# Patient Record
Sex: Male | Born: 1980 | Race: White | Hispanic: No | Marital: Married | Smoking: Never smoker
Health system: Southern US, Community
[De-identification: ages and names within clinical notes are randomized; demographics above are authoritative.]

## PROBLEM LIST (undated history)

## (undated) DIAGNOSIS — I10 Essential (primary) hypertension: Secondary | ICD-10-CM

## (undated) DIAGNOSIS — Z5189 Encounter for other specified aftercare: Secondary | ICD-10-CM

## (undated) HISTORY — DX: Essential (primary) hypertension: I10

## (undated) HISTORY — PX: TONSILLECTOMY: SUR1361

## (undated) HISTORY — PX: VASECTOMY: SHX75

## (undated) HISTORY — DX: Encounter for other specified aftercare: Z51.89

## (undated) HISTORY — PX: WISDOM TOOTH EXTRACTION: SHX21

---

## 2005-11-01 ENCOUNTER — Ambulatory Visit: Payer: Self-pay | Admitting: Internal Medicine

## 2008-09-04 ENCOUNTER — Telehealth: Payer: Self-pay | Admitting: Internal Medicine

## 2008-09-05 ENCOUNTER — Ambulatory Visit: Payer: Self-pay | Admitting: Internal Medicine

## 2008-09-05 DIAGNOSIS — R079 Chest pain, unspecified: Secondary | ICD-10-CM

## 2008-09-05 LAB — CONVERTED CEMR LAB: CK-MB: 1.2 ng/mL (ref 0.3–4.0)

## 2010-09-11 ENCOUNTER — Encounter: Payer: Self-pay | Admitting: Internal Medicine

## 2010-10-04 ENCOUNTER — Other Ambulatory Visit (INDEPENDENT_AMBULATORY_CARE_PROVIDER_SITE_OTHER): Payer: BC Managed Care – PPO | Admitting: Internal Medicine

## 2010-10-04 DIAGNOSIS — Z0000 Encounter for general adult medical examination without abnormal findings: Secondary | ICD-10-CM

## 2010-10-04 LAB — POCT URINALYSIS DIPSTICK
Nitrite, UA: NEGATIVE
Protein, UA: NEGATIVE
Urobilinogen, UA: 0.2
pH, UA: 7

## 2010-10-04 LAB — CBC WITH DIFFERENTIAL/PLATELET
Basophils Absolute: 0 10*3/uL (ref 0.0–0.1)
Eosinophils Absolute: 0.1 10*3/uL (ref 0.0–0.7)
Hemoglobin: 14.8 g/dL (ref 13.0–17.0)
Lymphocytes Relative: 27.8 % (ref 12.0–46.0)
Monocytes Relative: 8.6 % (ref 3.0–12.0)
Neutro Abs: 4.3 10*3/uL (ref 1.4–7.7)
Neutrophils Relative %: 61.4 % (ref 43.0–77.0)
RDW: 13.2 % (ref 11.5–14.6)

## 2010-10-04 LAB — BASIC METABOLIC PANEL
Calcium: 9.3 mg/dL (ref 8.4–10.5)
Creatinine, Ser: 1 mg/dL (ref 0.4–1.5)
GFR: 94.2 mL/min (ref >60.00)
Glucose, Bld: 104 mg/dL — ABNORMAL HIGH (ref 70–99)
Sodium: 141 mEq/L (ref 135–145)

## 2010-10-04 LAB — LIPID PANEL
HDL: 44.1 mg/dL (ref >39.00)
LDL Cholesterol: 85 mg/dL (ref 0–99)
Total CHOL/HDL Ratio: 4
VLDL: 39.6 mg/dL (ref 0.0–40.0)

## 2010-10-04 LAB — HEPATIC FUNCTION PANEL
AST: 38 U/L — ABNORMAL HIGH (ref 0–37)
Alkaline Phosphatase: 38 U/L — ABNORMAL LOW (ref 39–117)
Bilirubin, Direct: 0.2 mg/dL (ref 0.0–0.3)
Total Bilirubin: 1.1 mg/dL (ref 0.3–1.2)

## 2010-10-04 LAB — TSH: TSH: 1.16 u[IU]/mL (ref 0.35–5.50)

## 2010-10-11 ENCOUNTER — Ambulatory Visit (INDEPENDENT_AMBULATORY_CARE_PROVIDER_SITE_OTHER): Payer: BC Managed Care – PPO | Admitting: Internal Medicine

## 2010-10-11 ENCOUNTER — Encounter: Payer: Self-pay | Admitting: Internal Medicine

## 2010-10-11 VITALS — BP 132/86 | HR 72 | Temp 97.9°F | Ht 69.25 in | Wt 200.0 lb

## 2010-10-11 DIAGNOSIS — Z23 Encounter for immunization: Secondary | ICD-10-CM

## 2010-10-11 DIAGNOSIS — Z0000 Encounter for general adult medical examination without abnormal findings: Secondary | ICD-10-CM

## 2010-10-11 NOTE — Progress Notes (Signed)
  Subjective:    Patient ID: Dan [name], male    DOB: 01-Mar-1981, 30 y.o.   MRN: [id number]  HPI  cpx  Past Medical History  Diagnosis Date  . Blood transfusion without reported diagnosis     HIV and Hep C negative-2000-pt report   Past Surgical History  Procedure Date  . Tonsillectomy     post op bleeding (pt report)    reports that he has never smoked. He does not have any smokeless tobacco history on file. His alcohol and drug histories not on file. family history includes Hyperlipidemia in his father.    Allergies  Allergen Reactions  . Amoxicillin     REACTION: as child unspecified     Review of Systems  patient denies chest pain, shortness of breath, orthopnea. Denies lower extremity edema, abdominal pain, change in appetite, change in bowel movements. Patient denies rashes, musculoskeletal complaints. No other specific complaints in a complete review of systems.      Objective:   Physical Exam  well-developed well-nourished male in no acute distress. HEENT exam atraumatic, normocephalic, neck supple without jugular venous distention. Chest clear to auscultation cardiac exam S1-S2 are regular. Abdominal exam overweight with bowel sounds, soft and nontender. Extremities no edema. Neurologic exam is alert with a normal gait.        Assessment & Plan:  Health maint utd

## 2012-01-31 ENCOUNTER — Ambulatory Visit (INDEPENDENT_AMBULATORY_CARE_PROVIDER_SITE_OTHER): Payer: BC Managed Care – PPO | Admitting: Internal Medicine

## 2012-01-31 ENCOUNTER — Encounter: Payer: Self-pay | Admitting: Internal Medicine

## 2012-01-31 VITALS — BP 140/90 | Temp 97.9°F | Wt 205.0 lb

## 2012-01-31 DIAGNOSIS — S0990XS Unspecified injury of head, sequela: Secondary | ICD-10-CM

## 2012-01-31 DIAGNOSIS — X58XXXS Exposure to other specified factors, sequela: Secondary | ICD-10-CM

## 2012-01-31 DIAGNOSIS — G44309 Post-traumatic headache, unspecified, not intractable: Secondary | ICD-10-CM

## 2012-01-31 DIAGNOSIS — IMO0001 Reserved for inherently not codable concepts without codable children: Secondary | ICD-10-CM

## 2012-01-31 NOTE — Patient Instructions (Signed)
Head imaging studies as discussed Call if any clinical change

## 2012-01-31 NOTE — Progress Notes (Signed)
  Subjective:    Patient ID: Dan [name], male    DOB: [birth year], 31 y.o.   MRN: [id number]  HPI 31 year old patient who presents with a one-week history of exertional headaches. Over this period of time headaches occur every time he attempts at weight training. He has been forced to discontinue weight training do to the predictable headaches. He describes a severe achiness involving the right frontal and right retro-orbital area. No nausea or vomiting. Denies any focal neurological symptoms. He has had a mild headache on a single occasion during sexual activity. No headache with  Valsalva maneuvers  such as cough or defecation. No recent or remote history of head trauma. No family history of headaches CNS neoplasm or CNS aneurysm.  Headaches do not occur with more modest exercise such as walking and light exercise. They do not occur with aerobic activities    Review of Systems  Neurological: Positive for headaches.       Objective:   Physical Exam  Constitutional: He is oriented to person, place, and time. He appears well-developed and well-nourished. No distress.  Eyes:       Fundi appear normal without papilledema  Neurological: He is alert and oriented to person, place, and time. He has normal reflexes. He displays normal reflexes. No cranial nerve deficit. He exhibits normal muscle tone. Coordination normal.          Assessment & Plan:  New onset exertional headaches in a patient with a normal neurological examination and benign family history.  Rule out posterior fossa neoplasm or vascular abnormality We'll proceed with brain MRI/ MRA. If these are negative we'll consider a trial of anti-inflammatory medications (indomethacin)  or possibly propranolol

## 2012-02-02 ENCOUNTER — Ambulatory Visit
Admission: RE | Admit: 2012-02-02 | Discharge: 2012-02-02 | Disposition: A | Discharge status: Home / Self Care | Payer: BC Managed Care – PPO | Source: Ambulatory Visit | Attending: Internal Medicine | Admitting: Internal Medicine

## 2012-02-02 ENCOUNTER — Telehealth: Payer: Self-pay | Admitting: Family Medicine

## 2012-02-02 DIAGNOSIS — G44309 Post-traumatic headache, unspecified, not intractable: Secondary | ICD-10-CM

## 2012-02-02 NOTE — Telephone Encounter (Signed)
[name] is Dan [name], but Dr. Kirtland Bouchard ordered an MRI, which [name] had today. He is leaving town Saturday, and wanted to know if he could be called tomorrow before 5pm with the results, if at all possible. Please advise.

## 2012-02-03 ENCOUNTER — Ambulatory Visit: Payer: BC Managed Care – PPO | Admitting: Internal Medicine

## 2012-02-03 NOTE — Telephone Encounter (Signed)
Pt returning Dr. Vernon Prey call - best number to call back at - [phone]

## 2012-02-15 ENCOUNTER — Encounter: Payer: Self-pay | Admitting: Internal Medicine

## 2012-02-15 ENCOUNTER — Telehealth: Payer: Self-pay | Admitting: Internal Medicine

## 2012-02-15 ENCOUNTER — Ambulatory Visit (INDEPENDENT_AMBULATORY_CARE_PROVIDER_SITE_OTHER): Payer: BC Managed Care – PPO | Admitting: Internal Medicine

## 2012-02-15 VITALS — BP 152/98 | Temp 98.9°F | Wt 202.0 lb

## 2012-02-15 DIAGNOSIS — R51 Headache: Secondary | ICD-10-CM

## 2012-02-15 DIAGNOSIS — G4484 Primary exertional headache: Secondary | ICD-10-CM

## 2012-02-15 DIAGNOSIS — I10 Essential (primary) hypertension: Secondary | ICD-10-CM | POA: Insufficient documentation

## 2012-02-15 MED ORDER — BENAZEPRIL HCL 10 MG PO TABS: 10.0000 mg | ORAL_TABLET | Freq: Every day | ORAL | Status: DC

## 2012-02-15 NOTE — Assessment & Plan Note (Signed)
Patient has been monitoring his blood pressure at home. Systolic blood pressure readings range between 140s and 150s. He has strong family history of hypertension. Start benazepril 10 mg once daily. We discussed potential side effects.  Unclear whether mild hypertension is contributing to exertional headaches.

## 2012-02-15 NOTE — Assessment & Plan Note (Signed)
30 year old white male with exertional headaches. MRI MRA of brain is negative for aneurysm. Patient advised to use 40 mg of ibuprofen prior to strenuous physical activity.

## 2012-02-15 NOTE — Telephone Encounter (Signed)
Caller: Maxson/Patient; PCP: Birdie Sons; CB#: [phone];  Call regarding Headache; sx started 3 weeks ago; h/a seem to come on with exercise; seen in the office on 01/31/12 and MRI and MRIA were negative; instructed to stop exercising for 1 week; still had h/a without exercise; instructed to call back if sx continued; pt would like to be seen in the office today; pt is leaving out of country for 2 weeks tomorrow; taking Eleve and nothing seems to work; Triaged per Headache Guideline; See in 24 hr d/t typical h/a and therapy is not working; pt would like to be seen today since leaving to go to [place] tomorrow for 2 weeks; appt made for 3:15pm today Dr Artist Pais; will comply

## 2012-02-15 NOTE — Progress Notes (Signed)
  Subjective:    Patient ID: Dan [name], male    DOB: Oct 29, 1980, 31 y.o.   MRN: [id number]  HPI  31 year old white male previously seen by his primary care physician for exertional headache for followup. Patient reports severe right-sided headache triggered by weight lifting. There was concern for possible aneurysm. MRI/MRA of brain was normal. Patient decreased his physical activity and recently returned to exercising. He has experiencing recurrent headache symptoms.  Patient noted also to have mildly elevated blood pressure readings. He monitors his blood pressures at home and systolic blood pressure readings range between 140s and 150s. He has strong family history of hypertension.  Review of Systems Negative for visual changes,  Negative for photophobia or nausea  Past Medical History  Diagnosis Date  . Blood transfusion without reported diagnosis     HIV and Hep C negative-2000-pt report    History   Social History  . Marital Status: Married    Spouse Name: N/A    Number of Children: N/A  . Years of Education: N/A   Occupational History  . Not on file.   Social History Main Topics  . Smoking status: Never Smoker   . Smokeless tobacco: Never Used  . Alcohol Use: Yes  . Drug Use: No  . Sexually Active: Not on file   Other Topics Concern  . Not on file   Social History Narrative  . No narrative on file    Past Surgical History  Procedure Date  . Tonsillectomy     post op bleeding (pt report)    Family History  Problem Relation Age of Onset  . Hyperlipidemia Father     Allergies  Allergen Reactions  . Amoxicillin     REACTION: as child unspecified    Current Outpatient Prescriptions on File Prior to Visit  Medication Sig Dispense Refill  . fish oil-omega-3 fatty acids 1000 MG capsule Take 3 g by mouth daily.      . Multiple Vitamin (MULTIVITAMIN) tablet Take 2 tablets by mouth daily.       . Nutritional Supplements (SOY PROTEIN SHAKE PO) Take  by mouth daily.      . benazepril (LOTENSIN) 10 MG tablet Take 1 tablet (10 mg total) by mouth daily.  30 tablet  1    BP 152/98  Temp 98.9 F (37.2 C) (Oral)  Wt 202 lb (91.627 kg)       Objective:   Physical Exam  Constitutional: He is oriented to person, place, and time. He appears well-developed and well-nourished.  Cardiovascular: Normal rate, regular rhythm and normal heart sounds.   No murmur heard.      No murmur with hand grip or Valsalva maneuver  Pulmonary/Chest: Effort normal and breath sounds normal. He has no wheezes.  Neurological: He is alert and oriented to person, place, and time. No cranial nerve deficit. He exhibits normal muscle tone.  Skin: Skin is warm and dry.  Psychiatric: He has a normal mood and affect. His behavior is normal.          Assessment & Plan:

## 2012-02-15 NOTE — Patient Instructions (Addendum)
Premedicate with ibuprofen 400 mg 1 hour before physical activity Please call our office if you experience any side effects from new blood pressure medication. Please complete the following lab tests before your next follow up appointment: BMET - 401.9

## 2012-03-16 ENCOUNTER — Ambulatory Visit (INDEPENDENT_AMBULATORY_CARE_PROVIDER_SITE_OTHER): Payer: BC Managed Care – PPO | Admitting: Internal Medicine

## 2012-03-16 ENCOUNTER — Encounter: Payer: Self-pay | Admitting: Internal Medicine

## 2012-03-16 VITALS — BP 118/76 | HR 72 | Temp 98.1°F | Resp 16 | Wt 201.0 lb

## 2012-03-16 DIAGNOSIS — I10 Essential (primary) hypertension: Secondary | ICD-10-CM

## 2012-03-16 DIAGNOSIS — R51 Headache: Secondary | ICD-10-CM

## 2012-03-16 DIAGNOSIS — G4484 Primary exertional headache: Secondary | ICD-10-CM

## 2012-03-16 LAB — BASIC METABOLIC PANEL
Chloride: 100 mEq/L (ref 96–112)
Potassium: 3.8 mEq/L (ref 3.5–5.1)
Sodium: 139 mEq/L (ref 135–145)

## 2012-03-16 MED ORDER — BENAZEPRIL HCL 10 MG PO TABS: 10.0000 mg | ORAL_TABLET | Freq: Every day | ORAL | Status: DC

## 2012-03-16 NOTE — Assessment & Plan Note (Signed)
Good response to benazepril. Continue 10 mg. Monitor which was a kidney function.

## 2012-03-16 NOTE — Progress Notes (Signed)
  Subjective:    Patient ID: Dan [name], male    DOB: Jun 20, 1981, 31 y.o.   MRN: [id number]  HPI  31 year old white male with history of exertional headache and hypertension for followup. Patient reports since previous visit he has only had one mild headache. He premedicated with Aleve before lifting weights. He is tolerating benazepril without difficulty.   Review of Systems No chest pain, no chronic cough  Past Medical History  Diagnosis Date  . Blood transfusion without reported diagnosis     HIV and Hep C negative-2000-pt report    History   Social History  . Marital Status: Married    Spouse Name: N/A    Number of Children: N/A  . Years of Education: N/A   Occupational History  . Not on file.   Social History Main Topics  . Smoking status: Never Smoker   . Smokeless tobacco: Never Used  . Alcohol Use: Yes  . Drug Use: No  . Sexually Active: Not on file   Other Topics Concern  . Not on file   Social History Narrative  . No narrative on file    Past Surgical History  Procedure Date  . Tonsillectomy     post op bleeding (pt report)    Family History  Problem Relation Age of Onset  . Hyperlipidemia Father     Allergies  Allergen Reactions  . Amoxicillin     REACTION: as child unspecified    Current Outpatient Prescriptions on File Prior to Visit  Medication Sig Dispense Refill  . fish oil-omega-3 fatty acids 1000 MG capsule Take 3 g by mouth daily.      . Multiple Vitamin (MULTIVITAMIN) tablet Take 2 tablets by mouth daily.       . Nutritional Supplements (SOY PROTEIN SHAKE PO) Take by mouth daily.      Marland Kitchen DISCONTD: benazepril (LOTENSIN) 10 MG tablet Take 1 tablet (10 mg total) by mouth daily.  30 tablet  1    BP 118/76  Pulse 72  Temp 98.1 F (36.7 C) (Oral)  Resp 16  Wt 201 lb (91.173 kg)  SpO2 98%       Objective:   Physical Exam  Constitutional: He is oriented to person, place, and time. He appears well-developed and  well-nourished.  Cardiovascular: Normal rate, regular rhythm and normal heart sounds.   Pulmonary/Chest: Effort normal and breath sounds normal. He has no wheezes.  Musculoskeletal: He exhibits no edema.  Neurological: He is alert and oriented to person, place, and time.          Assessment & Plan:

## 2012-03-16 NOTE — Assessment & Plan Note (Signed)
Exertional headache improved with treating blood pressure. Patient advised to use of short acting NSAID such as ibuprofen before weightlifting. Monitor electrolytes and kidney function. Patient understands to take ibuprofen with food and drink plenty of water.

## 2012-03-26 ENCOUNTER — Ambulatory Visit (INDEPENDENT_AMBULATORY_CARE_PROVIDER_SITE_OTHER): Payer: BC Managed Care – PPO | Admitting: Family Medicine

## 2012-03-26 ENCOUNTER — Encounter: Payer: Self-pay | Admitting: Family Medicine

## 2012-03-26 VITALS — BP 126/80 | Temp 98.4°F | Wt 207.0 lb

## 2012-03-26 DIAGNOSIS — R300 Dysuria: Secondary | ICD-10-CM

## 2012-03-26 DIAGNOSIS — N451 Epididymitis: Secondary | ICD-10-CM

## 2012-03-26 DIAGNOSIS — N453 Epididymo-orchitis: Secondary | ICD-10-CM

## 2012-03-26 LAB — POCT URINALYSIS DIPSTICK
Ketones, UA: NEGATIVE
Protein, UA: NEGATIVE
Spec Grav, UA: 1.015
pH, UA: 6

## 2012-03-26 MED ORDER — CIPROFLOXACIN HCL 500 MG PO TABS: 500.0000 mg | ORAL_TABLET | Freq: Two times a day (BID) | ORAL | Status: AC

## 2012-03-27 ENCOUNTER — Encounter: Payer: Self-pay | Admitting: Family Medicine

## 2012-03-27 NOTE — Progress Notes (Signed)
  Subjective:    Patient ID: Dan [name], male    DOB: 01-May-1981, 31 y.o.   MRN: [id number]  HPI Here for one week of mild discomfort in the penis and the testicles and of an intermittent "vibrating" sensation in the penis. No urinary urgency or burning. No urethral DC. No fever or back pain. He drinks a lot of water. He has never felt this before. No recent trauma or bike riding.    Review of Systems  Constitutional: Negative.   Gastrointestinal: Negative.   Genitourinary: Positive for penile pain and testicular pain. Negative for dysuria, urgency, frequency, hematuria, flank pain, decreased urine volume, discharge, penile swelling, scrotal swelling, enuresis, difficulty urinating and genital sores.       Objective:   Physical Exam  Constitutional: He appears well-developed and well-nourished.  Abdominal: Soft. Bowel sounds are normal. He exhibits no distension and no mass. There is no tenderness. There is no rebound and no guarding.  Genitourinary: Rectum normal, prostate normal and penis normal. No penile tenderness.       Mildly tender over both epididymes           Assessment & Plan:  Add Aleve bid prn .

## 2012-09-16 DIAGNOSIS — Z0000 Encounter for general adult medical examination without abnormal findings: Secondary | ICD-10-CM | POA: Insufficient documentation

## 2012-09-16 NOTE — Progress Notes (Signed)
Patient ID: Dan [name], male   DOB: 1981-01-16, 32 y.o.   MRN: [id number] htn- Tolerating meds but he thinks the meds are makin him exhausted.  He has not been following an exercise or diet program. No home BPs.  He denies daytime hypersomnolence.   Reviewed pmh, psh, sochx, meds   patient denies chest pain, shortness of breath, orthopnea. Denies lower extremity edema, abdominal pain, change in appetite, change in bowel movements. Patient denies rashes, musculoskeletal complaints. No other specific complaints in a complete review of systems.    Well-developed well-nourished male in no acute distress. HEENT exam atraumatic, normocephalic, extraocular muscles are intact. Neck is supple. No jugular venous distention no thyromegaly. Chest clear to auscultation without increased work of breathing. Cardiac exam S1 and S2 are regular. Abdominal exam active bowel sounds, soft, nontender. Extremities no edema. Neurologic exam she is alert without any motor sensory deficits. Gait is normal.

## 2012-09-17 ENCOUNTER — Ambulatory Visit (INDEPENDENT_AMBULATORY_CARE_PROVIDER_SITE_OTHER): Payer: BC Managed Care – PPO | Admitting: Internal Medicine

## 2012-09-17 ENCOUNTER — Encounter: Payer: Self-pay | Admitting: Internal Medicine

## 2012-09-17 VITALS — BP 156/100 | HR 76 | Temp 98.0°F | Wt 213.0 lb

## 2012-09-17 DIAGNOSIS — E663 Overweight: Secondary | ICD-10-CM

## 2012-09-17 DIAGNOSIS — R5381 Other malaise: Secondary | ICD-10-CM

## 2012-09-17 DIAGNOSIS — R5383 Other fatigue: Secondary | ICD-10-CM

## 2012-09-17 DIAGNOSIS — I10 Essential (primary) hypertension: Secondary | ICD-10-CM

## 2012-09-17 LAB — CBC WITH DIFFERENTIAL/PLATELET
Eosinophils Absolute: 0.1 10*3/uL (ref 0.0–0.7)
MCHC: 34.4 g/dL (ref 30.0–36.0)
MCV: 85.9 fl (ref 78.0–100.0)
Monocytes Absolute: 0.7 10*3/uL (ref 0.1–1.0)
Neutrophils Relative %: 63.7 % (ref 43.0–77.0)
Platelets: 148 10*3/uL — ABNORMAL LOW (ref 150.0–400.0)
RDW: 12.4 % (ref 11.5–14.6)

## 2012-09-17 LAB — BASIC METABOLIC PANEL
BUN: 13 mg/dL (ref 6–23)
CO2: 29 mEq/L (ref 19–32)
Chloride: 100 mEq/L (ref 96–112)
Creatinine, Ser: 1 mg/dL (ref 0.4–1.5)

## 2012-09-17 MED ORDER — LOSARTAN POTASSIUM 100 MG PO TABS: 100.0000 mg | ORAL_TABLET | Freq: Every day | ORAL | Status: DC

## 2012-09-17 NOTE — Assessment & Plan Note (Signed)
There is some concern that meds are causing fatigue Stop benazepril and start losartan  May need to consider OSA as cause of fatigue-- check labs today

## 2012-09-17 NOTE — Addendum Note (Signed)
Addended by: Bonnye Fava on: 09/17/2012 08:38 AM   Modules accepted: Orders

## 2012-09-20 ENCOUNTER — Telehealth: Payer: Self-pay | Admitting: Internal Medicine

## 2012-09-20 NOTE — Telephone Encounter (Signed)
Patient called stating that he would like a call back with lab results. Please assist.  °

## 2012-09-21 NOTE — Telephone Encounter (Signed)
Dr Lovell Sheehan looked at labs and stated that labs were normal but glucose is high.  If pt was not fasting then ok but if he was fasting then he needs to come in for a hgba1c for hyperglycemia.  Left message for pt to call back

## 2012-09-21 NOTE — Telephone Encounter (Signed)
Patient would like a call back today.

## 2012-09-21 NOTE — Telephone Encounter (Signed)
Pt was not fasting, told pt labs were normal

## 2013-09-12 ENCOUNTER — Encounter: Payer: Self-pay | Admitting: Family

## 2013-09-12 ENCOUNTER — Ambulatory Visit (INDEPENDENT_AMBULATORY_CARE_PROVIDER_SITE_OTHER): Payer: BC Managed Care – PPO | Admitting: Family

## 2013-09-12 VITALS — BP 122/84 | HR 79 | Temp 98.2°F | Wt 217.0 lb

## 2013-09-12 DIAGNOSIS — R059 Cough, unspecified: Secondary | ICD-10-CM

## 2013-09-12 DIAGNOSIS — R05 Cough: Secondary | ICD-10-CM

## 2013-09-12 DIAGNOSIS — J069 Acute upper respiratory infection, unspecified: Secondary | ICD-10-CM

## 2013-09-12 MED ORDER — GUAIFENESIN-CODEINE 100-10 MG/5ML PO SYRP: 5.0000 mL | ORAL_SOLUTION | Freq: Three times a day (TID) | ORAL | Status: DC | PRN

## 2013-09-12 MED ORDER — FLUTICASONE PROPIONATE 50 MCG/ACT NA SUSP: 2.0000 | Freq: Every day | NASAL | Status: DC

## 2013-09-12 NOTE — Patient Instructions (Signed)

## 2013-09-12 NOTE — Progress Notes (Signed)
Pre visit review using our clinic review tool, if applicable. No additional management support is needed unless otherwise documented below in the visit note. 

## 2013-09-12 NOTE — Progress Notes (Signed)
   Subjective:    Patient ID: Dan [name], male    DOB: 03/26/1981, 33 y.o.   MRN: [id number]  HPI 33 year old white male, nonsmoker, patient of Dr. Cato MulliganSwords in today with complaints of a cough, nasal congestion x4 days. Has been taken Mucinex to help some. Denies any fever, muscle aches or pain.   Review of Systems  HENT: Positive for congestion and postnasal drip.   Eyes: Negative.   Respiratory: Positive for cough. Negative for shortness of breath and wheezing.   Cardiovascular: Negative.   Musculoskeletal: Negative.   Skin: Negative.   Allergic/Immunologic: Negative.   Neurological: Negative.   Psychiatric/Behavioral: Negative.    Past Medical History  Diagnosis Date  . Blood transfusion without reported diagnosis     HIV and Hep C negative-2000-pt report    History   Social History  . Marital Status: Married    Spouse Name: N/A    Number of Children: N/A  . Years of Education: N/A   Occupational History  . Not on file.   Social History Main Topics  . Smoking status: Never Smoker   . Smokeless tobacco: Never Used  . Alcohol Use: Yes  . Drug Use: No  . Sexual Activity: Not on file   Other Topics Concern  . Not on file   Social History Narrative  . No narrative on file    Past Surgical History  Procedure Laterality Date  . Tonsillectomy      post op bleeding (pt report)    Family History  Problem Relation Age of Onset  . Hyperlipidemia Father     Allergies  Allergen Reactions  . Amoxicillin     REACTION: as child unspecified    Current Outpatient Prescriptions on File Prior to Visit  Medication Sig Dispense Refill  . losartan (COZAAR) 100 MG tablet Take 1 tablet (100 mg total) by mouth daily.  90 tablet  3   No current facility-administered medications on file prior to visit.    BP 122/84  Pulse 79  Temp(Src) 98.2 F (36.8 C) (Oral)  Wt 217 lb (98.431 kg)chart    Objective:   Physical Exam  Constitutional: He is oriented to  person, place, and time. He appears well-developed and well-nourished.  HENT:  Right Ear: External ear normal.  Left Ear: External ear normal.  Mouth/Throat: Oropharynx is clear and moist.  Nasal turbinates edematous  Neck: Normal range of motion. Neck supple.  Cardiovascular: Normal rate, regular rhythm and normal heart sounds.   Pulmonary/Chest: Effort normal and breath sounds normal.  Musculoskeletal: Normal range of motion.  Neurological: He is alert and oriented to person, place, and time.  Skin: Skin is warm and dry.  Psychiatric: He has a normal mood and affect.          Assessment & Plan:  Assessment: 1. Upper respiratory infection 2. Cough  Plan: Flonase 2 sprays in each nostril once a day. Her intestine as needed for cough. Warned of drowsiness. Do not drive. Patient upon the office if symptoms worsen or persist. Recheck as scheduled, and as needed.

## 2013-10-31 ENCOUNTER — Other Ambulatory Visit: Payer: Self-pay | Admitting: Internal Medicine

## 2014-10-30 ENCOUNTER — Ambulatory Visit (INDEPENDENT_AMBULATORY_CARE_PROVIDER_SITE_OTHER): Payer: BC Managed Care – PPO | Admitting: Family Medicine

## 2014-10-30 ENCOUNTER — Encounter: Payer: Self-pay | Admitting: Family Medicine

## 2014-10-30 VITALS — BP 100/64 | HR 89 | Temp 98.4°F | Ht 70.0 in | Wt 189.0 lb

## 2014-10-30 DIAGNOSIS — I10 Essential (primary) hypertension: Secondary | ICD-10-CM

## 2014-10-30 DIAGNOSIS — E663 Overweight: Secondary | ICD-10-CM

## 2014-10-30 NOTE — Assessment & Plan Note (Signed)
Much improved control after 30 lbs weight loss. No losartan today and BP 100/64. We decided to discontinue at this point but patient will give me updated Bp #s before he leaves country.

## 2014-10-30 NOTE — Progress Notes (Signed)
  Tana ConchStephen Dezirea Mccollister, MD Phone: [phone]  Subjective:   Dan RaiderMatthew H [name] is a 34 y.o. year old very pleasant male patient who presents with the following:  Hypertension Obesity/overweight -patient has lost 28 pounds in 2 months per our readings (30.5 per his readings at home). He is hoping to get off of his losartan due to loss in weight. [facility] diet- telemedicine. Mainly chicken, broccoli, fresh veggies. 6 weeks of weight loss and now transitioning to maintenance program to last 6 weeks. No exercise was a part of the weight loss but it is a part of maintenance. Home BPs are BP Readings from Last 3 Encounters:  10/30/14 100/64  09/12/13 122/84  09/17/12 156/100   Home BP monitoring- running low 110s over 60-70.  Compliant with medications-did not take losartan this morning with BP 110/70.  ROS-Denies any CP, HA, SOB, blurry vision, LE edema, transient weakness, orthopnea, PND.   Past Medical History- Patient Active Problem List   Diagnosis Date Noted  . Overweight 09/16/2012  . Hypertension 02/15/2012   Medications- reviewed and updated Current Outpatient Prescriptions  Medication Sig Dispense Refill  . fluticasone (FLONASE) 50 MCG/ACT nasal spray Place 2 sprays into both nostrils daily. (Patient not taking: Reported on 10/30/2014) 16 g 6  . losartan (COZAAR) 100 MG tablet TAKE 1 TABLET BY MOUTH DAILY (Patient not taking: Reported on 10/30/2014) 90 tablet 3   Objective: BP 100/64 mmHg  Pulse 89  Temp(Src) 98.4 F (36.9 C)  Ht 5\' 10"  (1.778 m)  Wt 189 lb (85.73 kg)  BMI 27.12 kg/m2 Gen: NAD, resting comfortably  CV: RRR no murmurs rubs or gallops Lungs: CTAB no crackles, wheeze, rhonchi Abdomen: soft/nontender/nondistended/normal bowel sounds.  Ext: no edema Skin: warm, dry, no rash   Assessment/Plan:  Hypertension Much improved control after 30 lbs weight loss. No losartan today and BP 100/64. We decided to discontinue at this point but patient will give me updated  Bp #s before he leaves country.    Overweight Lost 30 lbs through [employer] in town over 6 weeks. No transitioning to maintenance and will add exercise. Praised patient efforts and seems he made appropriate food choices in this time. BMI reads as 27 but I believe this is an appropriate weight for him given muscle mass. I agree with targetting maintenance and not further weight loss.    follow up for CPE next month as scheduled previously. He will messageme BPs on 22nd or 23rd of this month for last week.   Health Maintenance Due  Topic Date Due  . HIV Screening -asked to have patient request add on to labs for CPE for unprotected sex though with wife.  07/16/1995

## 2014-10-30 NOTE — Assessment & Plan Note (Signed)
Lost 30 lbs through [employer] [facility] in town over 6 weeks. No transitioning to maintenance and will add exercise. Praised patient efforts and seems he made appropriate food choices in this time. BMI reads as 27 but I believe this is an appropriate weight for him given muscle mass. I agree with targetting maintenance and not further weight loss.

## 2014-10-30 NOTE — Patient Instructions (Addendum)
Stay off losartan  Chart your blood pressures as you have been doing over the next week. Send me a message or call with your blood pressure log either on the 22nd or 23rd. Hoping you can go to the [place] without any blood pressure medicine

## 2014-11-06 ENCOUNTER — Telehealth: Payer: Self-pay | Admitting: Internal Medicine

## 2014-11-06 NOTE — Telephone Encounter (Signed)
FYI

## 2014-11-06 NOTE — Telephone Encounter (Signed)
Pt states his bp is doing great since being off his bp meds. It has been running 120/60, 118/66; 122/60 averaging these numbers. Pt states he feels great also, but was to touch base w/ you in a few days after his visit. Will see you 11/19/14!

## 2014-11-06 NOTE — Telephone Encounter (Signed)
That is GREAT news. Please wish him a safe and fun trip to the [place] for me.

## 2014-11-19 ENCOUNTER — Ambulatory Visit (INDEPENDENT_AMBULATORY_CARE_PROVIDER_SITE_OTHER): Payer: BC Managed Care – PPO | Admitting: Family Medicine

## 2014-11-19 ENCOUNTER — Encounter: Payer: Self-pay | Admitting: Family Medicine

## 2014-11-19 VITALS — BP 110/80 | HR 62 | Temp 98.1°F | Ht 70.0 in | Wt 183.0 lb

## 2014-11-19 DIAGNOSIS — Z0000 Encounter for general adult medical examination without abnormal findings: Secondary | ICD-10-CM

## 2014-11-19 DIAGNOSIS — Z7251 High risk heterosexual behavior: Secondary | ICD-10-CM

## 2014-11-19 LAB — LIPID PANEL
Cholesterol: 147 mg/dL (ref 0–200)
HDL: 37 mg/dL — ABNORMAL LOW (ref >39.00)
LDL Cholesterol: 78 mg/dL (ref 0–99)
NONHDL: 110
Total CHOL/HDL Ratio: 4
Triglycerides: 162 mg/dL — ABNORMAL HIGH (ref 0.0–149.0)
VLDL: 32.4 mg/dL (ref 0.0–40.0)

## 2014-11-19 LAB — COMPREHENSIVE METABOLIC PANEL
ALT: 33 U/L (ref 0–53)
AST: 18 U/L (ref 0–37)
Albumin: 4.3 g/dL (ref 3.5–5.2)
Alkaline Phosphatase: 40 U/L (ref 39–117)
BILIRUBIN TOTAL: 0.8 mg/dL (ref 0.2–1.2)
BUN: 14 mg/dL (ref 6–23)
CALCIUM: 9.9 mg/dL (ref 8.4–10.5)
CHLORIDE: 105 meq/L (ref 96–112)
CO2: 30 mEq/L (ref 19–32)
CREATININE: 1.03 mg/dL (ref 0.40–1.50)
GFR: 87.68 mL/min (ref >60.00)
Glucose, Bld: 100 mg/dL — ABNORMAL HIGH (ref 70–99)
Potassium: 4.7 mEq/L (ref 3.5–5.1)
Sodium: 141 mEq/L (ref 135–145)
Total Protein: 6.8 g/dL (ref 6.0–8.3)

## 2014-11-19 LAB — CBC
HCT: 45.9 % (ref 39.0–52.0)
HEMOGLOBIN: 15.4 g/dL (ref 13.0–17.0)
MCHC: 33.6 g/dL (ref 30.0–36.0)
MCV: 86.5 fl (ref 78.0–100.0)
PLATELETS: 150 10*3/uL (ref 150.0–400.0)
RBC: 5.31 Mil/uL (ref 4.22–5.81)
RDW: 13.5 % (ref 11.5–15.5)
WBC: 7.7 10*3/uL (ref 4.0–10.5)

## 2014-11-19 LAB — TSH: TSH: 0.89 u[IU]/mL (ref 0.35–4.50)

## 2014-11-19 NOTE — Patient Instructions (Addendum)
Update labs-mychart or call with results.   No longer need blood pressure medicine  Let's do 1 year follow up and if blood pressure remains in good shape and weight in good shape, may space to 2 years.

## 2014-11-19 NOTE — Progress Notes (Signed)
Pre visit review using our clinic review tool, if applicable. No additional management support is needed unless otherwise documented below in the visit note. 

## 2014-11-19 NOTE — Progress Notes (Signed)
Tana Conch, MD Phone: [phone]  Subjective:  Patient presents today for their annual physical. Chief complaint-noted.   Patient doing very well. Continues to lose weight with healthy eating and exercise changes. No longer requires BP medication.   Thinks was tested for HIV before age 34 but agrees to 1x repeat screen.   ROS- full  review of systems was completed and negative   The following were reviewed and entered/updated in epic: Past Medical History  Diagnosis Date  . 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 after tonsilectomy  . Hypertension    Patient Active Problem List   Diagnosis Date Noted  . Hypertension 02/15/2012    Priority: Medium  . Overweight 09/16/2012    Priority: Low   Past Surgical History  Procedure Laterality Date  . Tonsillectomy      post op bleeding (pt report)  . Wisdom tooth extraction     Family History  Problem Relation Age of Onset  . Hyperlipidemia Father   . Diabetes Paternal Grandfather     paternal grandmother  . Hypertension Father   . Thyroid disease Mother    Medications- reviewed and updated No current outpatient prescriptions on file.   No current facility-administered medications for this visit.    Allergies-reviewed and updated Allergies  Allergen Reactions  . Amoxicillin     REACTION: as child unspecified    History   Social History  . Marital Status: Married    Spouse Name: N/A  . Number of Children: N/A  . Years of Education: N/A   Social History Main Topics  . Smoking status: Never Smoker   . Smokeless tobacco: Never Used  . Alcohol Use: 0.0 - 1.2 oz/week    0-2 Standard drinks or equivalent per week  . Drug Use: No  . Sexual Activity:    Partners: Female   Other Topics Concern  . None   Social History Narrative   Married (wife with just obgyn), no children. 2 dogs-yorkies.       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 Oropharynx normal Neck: no thyromegaly CV: RRR no murmurs rubs or gallops Lungs: CTAB no crackles, wheeze, rhonchi Abdomen: soft/nontender/nondistended/normal bowel sounds. No rebound or guarding.  Ext: no edema Skin: warm, dry, no rash. No concerning lesions for cancer or precancer.  Neuro: grossly normal, moves all extremities, PERRLA  Assessment/Plan:  34 y.o. male presenting for annual physical.  Health Maintenance counseling: 1. Anticipatory guidance: Patient counseled regarding regular dental exams, wearing seatbelts, wearing sunscreen. Occasionally will see dermatology. Full skin check a few years ago 2. Risk factor reduction:  Advised patient of need for regular exercise (going to do some lifting as next step, primarily cardio right now) and diet rich and fruits and vegetables to reduce risk of heart attack and stroke.  3. Immunizations/screenings/ancillary studies Health Maintenance Due  Topic Date Due  . HIV Screening - today 07/16/1995  4. Overweight-current weight reasonable. Do not think requires further weight loss and BMI goal <25 due to muscle mass 5. BP- follow up 1 year at CPE as long as  maintains lower weight.   fasting Orders Placed This Encounter  Procedures  . CBC    [place]  . Comprehensive metabolic panel    [place]    Order Specific Question:  Has the patient fasted?    Answer:  No  . Lipid panel    [place]    Order Specific Question:  Has the patient fasted?    Answer:  No  . TSH    [place]  . HIV antibody (with reflex)

## 2014-11-20 LAB — HIV ANTIBODY (ROUTINE TESTING W REFLEX): HIV 1&2 Ab, 4th Generation: NONREACTIVE

## 2015-01-08 ENCOUNTER — Other Ambulatory Visit: Payer: Self-pay | Admitting: Internal Medicine

## 2015-06-03 ENCOUNTER — Encounter: Payer: Self-pay | Admitting: Family Medicine

## 2015-06-04 ENCOUNTER — Other Ambulatory Visit: Payer: Self-pay | Admitting: Family Medicine

## 2015-06-04 ENCOUNTER — Encounter: Payer: Self-pay | Admitting: Family Medicine

## 2015-11-16 ENCOUNTER — Other Ambulatory Visit (INDEPENDENT_AMBULATORY_CARE_PROVIDER_SITE_OTHER): Payer: BC Managed Care – PPO

## 2015-11-16 DIAGNOSIS — Z0000 Encounter for general adult medical examination without abnormal findings: Secondary | ICD-10-CM

## 2015-11-16 LAB — BASIC METABOLIC PANEL
BUN: 19 mg/dL (ref 6–23)
CHLORIDE: 103 meq/L (ref 96–112)
CO2: 33 mEq/L — ABNORMAL HIGH (ref 19–32)
CREATININE: 1 mg/dL (ref 0.40–1.50)
Calcium: 9.7 mg/dL (ref 8.4–10.5)
GFR: 90.2 mL/min (ref >60.00)
Glucose, Bld: 97 mg/dL (ref 70–99)
POTASSIUM: 4.4 meq/L (ref 3.5–5.1)
Sodium: 141 mEq/L (ref 135–145)

## 2015-11-16 LAB — LIPID PANEL
CHOLESTEROL: 152 mg/dL (ref 0–200)
HDL: 46 mg/dL (ref >39.00)
LDL Cholesterol: 76 mg/dL (ref 0–99)
NONHDL: 106.03
Total CHOL/HDL Ratio: 3
Triglycerides: 148 mg/dL (ref 0.0–149.0)
VLDL: 29.6 mg/dL (ref 0.0–40.0)

## 2015-11-16 LAB — CBC WITH DIFFERENTIAL/PLATELET
BASOS PCT: 0.3 % (ref 0.0–3.0)
Basophils Absolute: 0 10*3/uL (ref 0.0–0.1)
EOS ABS: 0 10*3/uL (ref 0.0–0.7)
EOS PCT: 0.4 % (ref 0.0–5.0)
HCT: 43.6 % (ref 39.0–52.0)
Hemoglobin: 14.6 g/dL (ref 13.0–17.0)
LYMPHS ABS: 1.7 10*3/uL (ref 0.7–4.0)
Lymphocytes Relative: 17.9 % (ref 12.0–46.0)
MCHC: 33.5 g/dL (ref 30.0–36.0)
MCV: 88.1 fl (ref 78.0–100.0)
MONO ABS: 0.7 10*3/uL (ref 0.1–1.0)
Monocytes Relative: 7.1 % (ref 3.0–12.0)
NEUTROS PCT: 74.3 % (ref 43.0–77.0)
Neutro Abs: 7.2 10*3/uL (ref 1.4–7.7)
PLATELETS: 144 10*3/uL — AB (ref 150.0–400.0)
RBC: 4.95 Mil/uL (ref 4.22–5.81)
RDW: 12.6 % (ref 11.5–15.5)
WBC: 9.7 10*3/uL (ref 4.0–10.5)

## 2015-11-16 LAB — POC URINALSYSI DIPSTICK (AUTOMATED)
Bilirubin, UA: NEGATIVE
Glucose, UA: NEGATIVE
Ketones, UA: NEGATIVE
LEUKOCYTES UA: NEGATIVE
NITRITE UA: NEGATIVE
PH UA: 7.5
Protein, UA: NEGATIVE
RBC UA: NEGATIVE
Spec Grav, UA: 1.02
UROBILINOGEN UA: 0.2

## 2015-11-16 LAB — TSH: TSH: 0.97 u[IU]/mL (ref 0.35–4.50)

## 2015-11-16 LAB — HEPATIC FUNCTION PANEL
ALBUMIN: 4.6 g/dL (ref 3.5–5.2)
ALT: 23 U/L (ref 0–53)
AST: 20 U/L (ref 0–37)
Alkaline Phosphatase: 63 U/L (ref 39–117)
BILIRUBIN DIRECT: 0.1 mg/dL (ref 0.0–0.3)
BILIRUBIN TOTAL: 0.9 mg/dL (ref 0.2–1.2)
TOTAL PROTEIN: 6.5 g/dL (ref 6.0–8.3)

## 2015-11-23 ENCOUNTER — Encounter: Payer: BC Managed Care – PPO | Admitting: Family Medicine

## 2015-12-03 ENCOUNTER — Ambulatory Visit (INDEPENDENT_AMBULATORY_CARE_PROVIDER_SITE_OTHER): Payer: BC Managed Care – PPO | Admitting: Family Medicine

## 2015-12-03 ENCOUNTER — Encounter: Payer: Self-pay | Admitting: Family Medicine

## 2015-12-03 VITALS — BP 110/70 | HR 63 | Temp 98.4°F | Ht 70.0 in | Wt 169.0 lb

## 2015-12-03 DIAGNOSIS — Z0000 Encounter for general adult medical examination without abnormal findings: Secondary | ICD-10-CM

## 2015-12-03 NOTE — Patient Instructions (Addendum)
No changes  You are doing fantastic

## 2015-12-03 NOTE — Progress Notes (Signed)
Tana Conch, MD Phone: [phone]  Subjective:  Patient presents today for their annual physical. Chief complaint-noted.   See problem oriented charting- ROS- full  review of systems was completed and negative including No chest pain or shortness of breath. No headache or blurry vision.   The following were reviewed and entered/updated in epic: Past Medical History  Diagnosis Date  . Blood transfusion without reported diagnosis     HIV and Hep C negative-early 90s, late 7s, pt report. after tonsilectomy  . Hypertension    Patient Active Problem List   Diagnosis Date Noted  . Health care maintenance 09/16/2012    Priority: Low  . Hypertension 02/15/2012    Priority: Low   Past Surgical History  Procedure Laterality Date  . Tonsillectomy      post op bleeding (pt report)  . Wisdom tooth extraction      Family History  Problem Relation Age of Onset  . Hyperlipidemia Father   . Diabetes Paternal Grandfather     paternal grandmother  . Hypertension Father   . Thyroid disease Mother     Medications- reviewed and updated No current outpatient prescriptions on file.   No current facility-administered medications for this visit.    Allergies-reviewed and updated Allergies  Allergen Reactions  . Amoxicillin     REACTION: as child unspecified    Social History   Social History  . Marital Status: Married    Spouse Name: N/A  . Number of Children: N/A  . Years of Education: N/A   Social History Main Topics  . Smoking status: Never Smoker   . Smokeless tobacco: Never Used  . Alcohol Use: 0.0 - 1.2 oz/week    0-2 Standard drinks or equivalent per week  . Drug Use: No  . Sexual Activity:    Partners: Female   Other Topics Concern  . None   Social History Narrative   Married (wife with just obgyn), no children. 2 dogs-yorkies.       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 Oropharynx normal Neck: no thyromegaly CV: RRR no murmurs rubs or gallops Lungs: CTAB no crackles, wheeze, rhonchi Abdomen: soft/nontender/nondistended/normal bowel sounds. No rebound or guarding.  Ext: no edema Skin: warm, dry Neuro: grossly normal, moves all extremities, PERRLA  Assessment/Plan:  35 y.o. male presenting for annual physical.  Health Maintenance counseling: 1. Anticipatory guidance: Patient counseled regarding regular dental exams, eye exams, wearing seatbelts.  2. Risk factor reduction:  Advised patient of need for regular exercise and diet rich and fruits and vegetables to reduce risk of heart attack and stroke. Has lost another 14 lbs with healthy eating and regular exercise- BP remains controlled without medication. Started crossfi and is really enjoying it.  Wt Readings from Last 3 Encounters:  12/03/15 169 lb (76.658 kg)  11/19/14 183 lb (83.008 kg)  10/30/14 189 lb (85.73 kg)  3. Immunizations/screenings/ancillary studies- up to date  Immunization History  Administered Date(s) Administered  . Influenza Split 05/15/2012  . Influenza, Seasonal, Injecte, Preservative Fre 06/03/2014  . Tdap 10/11/2010   4. Prostate cancer screening- start age 70 without family history  5. Colon cancer screening -  start age 35 without family history 6. Skin cancer screening- occasional dermatology visits  Return in about 2 years (around 12/02/2017) for physical. Return precautions advised.

## 2015-12-09 ENCOUNTER — Telehealth: Payer: Self-pay | Admitting: Family Medicine

## 2015-12-09 NOTE — Telephone Encounter (Signed)
Pt is at the travel med office at [facility] to get vaccinations for his upcoming trip to [place]. New policy in order for pt's insuranace to pay is a referral letter from the doctor stating he needs these travel vaccinations.  Fax: AttnJuline Patch: Dan [name] [phone]   Pt took off work this am to get these vaccines and would like to know if we can fax asap.

## 2015-12-09 NOTE — Telephone Encounter (Signed)
Info faxed

## 2016-04-04 ENCOUNTER — Ambulatory Visit (INDEPENDENT_AMBULATORY_CARE_PROVIDER_SITE_OTHER): Payer: BC Managed Care – PPO | Admitting: Family Medicine

## 2016-04-04 ENCOUNTER — Encounter: Payer: Self-pay | Admitting: Family Medicine

## 2016-04-04 VITALS — BP 120/72 | HR 65 | Temp 98.5°F | Wt 168.4 lb

## 2016-04-04 DIAGNOSIS — J029 Acute pharyngitis, unspecified: Secondary | ICD-10-CM | POA: Diagnosis not present

## 2016-04-04 NOTE — Progress Notes (Signed)
Pre visit review using our clinic review tool, if applicable. No additional management support is needed unless otherwise documented below in the visit note. 

## 2016-04-04 NOTE — Patient Instructions (Signed)
Viral pharyngitis  Treat with aleve twice a day for next 3-4 days. Suspect within 7-10 days total this will resolve. Some cases last 14 days but hopeful not. If it is lasting that long or you have new or worsening symptoms please let us know sooner.   Rest and hydration are going to be key- I know this will be hard with school starting but do your best

## 2016-04-04 NOTE — Progress Notes (Addendum)
Subjective:  Dan [name] is a 35 y.o. year old very pleasant male patient who presents for/with See problem oriented charting ROS- No chest pain or shortness of breath. No headache or blurry vision. No fever or chills.see any ROS included in HPI as well.   Past Medical History-  Patient Active Problem List   Diagnosis Date Noted  . Health care maintenance 09/16/2012    Priority: Low  . Hypertension 02/15/2012    Priority: Low    Medications- reviewed and updated, no meds  Objective: BP 120/72 (BP Location: Left Arm, Patient Position: Sitting, Cuff Size: Normal)   Pulse 65   Temp 98.5 F (36.9 C) (Oral)   Wt 168 lb 6.4 oz (76.4 kg)   SpO2 96%   BMI 24.16 kg/m  Gen: NAD, resting comfortably Tender cervical lymphadenopathy bilaterally pharynx erythematous- tonsils surgically absent TM normal CV: RRR no murmurs rubs or gallops Lungs: CTAB no crackles, wheeze, rhonchi Abdomen: soft/nontender/nondistended/normal bowel sounds.  Ext: no edema Skin: warm, dry, no rash Neuro: grossly normal, moves all extremities  Assessment/Plan:   Viral pharyngitis S: 4-5 days. Started with scratchy throat then had soreness in neck. Thought from working out. Felt swollen lymph nodes on both sides. Scratchy throat, stuffy head. Slightly "wobbly" in the AM - some pressure in head- gets better with day goes on. Today slightly better than yesterday. Yesterday worse day so far. Dealing with fatigue, feeling worn down. No medications tried other than some aleve- for sore throat and for mild headache. 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 Well appearing other than seems fatigued. Does not have tonsils- we discussed strep testing low yield considering also no fever, presence of cough. Advised rest, hydration,  nsaids. If not doing better in 14 days or febrile- return to care.   The duration of face-to-face time during this visit was 25 minutes. Greater than 50% of this time was spent in counseling, explanation of diagnosis, planning of further management, and/or coordination of care.     Return precautions advised.  Tana ConchStephen Toshio Slusher, MD

## 2018-03-26 ENCOUNTER — Encounter: Payer: Self-pay | Admitting: Family Medicine

## 2018-09-17 ENCOUNTER — Encounter: Payer: Self-pay | Admitting: Family Medicine

## 2018-09-17 ENCOUNTER — Ambulatory Visit: Payer: Self-pay

## 2018-09-17 MED ORDER — OSELTAMIVIR PHOSPHATE 75 MG PO CAPS: 75.0000 mg | ORAL_CAPSULE | Freq: Two times a day (BID) | ORAL | 0 refills | Status: DC

## 2018-09-17 NOTE — Addendum Note (Signed)
Addended by: Shelva MajesticHUNTER, STEPHEN O on: 09/17/2018 08:39 PM   Modules accepted: Orders

## 2018-09-17 NOTE — Telephone Encounter (Signed)
Pt. Is calling to report that his 10 mo. Old daughter was diagnosed with Influenza A today.  Stated she started with onset of fever on 09/15/18.  The pt. had onset of head and chest congestion last night.  Denied fever or chills today; has not checked temperature.  Denied traveling outside the [place] in past mo.     Is requesting Tamiflu.  Advised will send note to Dr. Durene Cal, and to expect a call back with any recommendation re: Tamiflu request.  Agrees with plan.    Reason for Disposition . [1] Influenza EXPOSURE within last 48 hours (2 days) AND [2] NOT HIGH RISK AND [3] strongly requests antiviral medication  Answer Assessment - Initial Assessment Questions 1. TYPE of EXPOSURE: "How were you exposed?" (e.g., close contact, not a close contact)     Infant daughter diagnosed with flu today 2. DATE of EXPOSURE: "When did the exposure occur?" (e.g., hour, days, weeks)     09/15/18 onset of fever with the child 3. PREGNANCY: "Is there any chance you are pregnant?" "When was your last menstrual period?"     N/a  4. HIGH RISK for COMPLICATIONS: "Do you have any heart or lung problems? Do you have a weakened immune system?" (e.g., CHF, COPD, asthma, HIV positive, chemotherapy, renal failure, diabetes mellitus, sickle cell anemia)     No immuno compromised condition 5. SYMPTOMS: "Do you have any symptoms?" (e.g., cough, fever, sore throat, difficulty breathing).     Head and chest congestion present; cough nonproductive; symptoms started last night.  Protocols used: INFLUENZA EXPOSURE-A-AH

## 2018-09-17 NOTE — Telephone Encounter (Signed)
I sent in tamiflu for him. Please have him schedule a physical in the next few months- he is close to being 3 years out from last visit which per insurance would make him a new patient nad would want to avoid this.

## 2018-09-17 NOTE — Telephone Encounter (Signed)
See note

## 2018-09-18 NOTE — Telephone Encounter (Signed)
Patient notified via My Chart

## 2018-10-15 ENCOUNTER — Encounter: Payer: Self-pay | Admitting: Family Medicine

## 2018-10-15 DIAGNOSIS — E559 Vitamin D deficiency, unspecified: Secondary | ICD-10-CM

## 2018-10-15 DIAGNOSIS — E039 Hypothyroidism, unspecified: Secondary | ICD-10-CM

## 2018-10-15 DIAGNOSIS — R5383 Other fatigue: Secondary | ICD-10-CM

## 2018-10-15 DIAGNOSIS — Z0000 Encounter for general adult medical examination without abnormal findings: Secondary | ICD-10-CM

## 2018-10-15 DIAGNOSIS — E118 Type 2 diabetes mellitus with unspecified complications: Secondary | ICD-10-CM

## 2018-10-15 DIAGNOSIS — I10 Essential (primary) hypertension: Secondary | ICD-10-CM

## 2018-10-31 ENCOUNTER — Other Ambulatory Visit: Payer: Self-pay

## 2018-10-31 ENCOUNTER — Other Ambulatory Visit: Payer: Self-pay | Admitting: Family Medicine

## 2018-10-31 ENCOUNTER — Other Ambulatory Visit (INDEPENDENT_AMBULATORY_CARE_PROVIDER_SITE_OTHER): Payer: BC Managed Care – PPO

## 2018-10-31 DIAGNOSIS — E118 Type 2 diabetes mellitus with unspecified complications: Secondary | ICD-10-CM | POA: Diagnosis not present

## 2018-10-31 DIAGNOSIS — E559 Vitamin D deficiency, unspecified: Secondary | ICD-10-CM | POA: Diagnosis not present

## 2018-10-31 DIAGNOSIS — R5383 Other fatigue: Secondary | ICD-10-CM

## 2018-10-31 DIAGNOSIS — I10 Essential (primary) hypertension: Secondary | ICD-10-CM | POA: Diagnosis not present

## 2018-10-31 DIAGNOSIS — R739 Hyperglycemia, unspecified: Secondary | ICD-10-CM

## 2018-10-31 DIAGNOSIS — E039 Hypothyroidism, unspecified: Secondary | ICD-10-CM

## 2018-10-31 LAB — COMPREHENSIVE METABOLIC PANEL
ALT: 17 U/L (ref 0–53)
AST: 16 U/L (ref 0–37)
Albumin: 4.6 g/dL (ref 3.5–5.2)
Alkaline Phosphatase: 40 U/L (ref 39–117)
BUN: 17 mg/dL (ref 6–23)
CO2: 31 mEq/L (ref 19–32)
Calcium: 9.4 mg/dL (ref 8.4–10.5)
Chloride: 103 mEq/L (ref 96–112)
Creatinine, Ser: 1.01 mg/dL (ref 0.40–1.50)
GFR: 82.54 mL/min (ref >60.00)
Glucose, Bld: 102 mg/dL — ABNORMAL HIGH (ref 70–99)
Potassium: 4.4 mEq/L (ref 3.5–5.1)
Sodium: 140 mEq/L (ref 135–145)
Total Bilirubin: 0.9 mg/dL (ref 0.2–1.2)
Total Protein: 6.7 g/dL (ref 6.0–8.3)

## 2018-10-31 LAB — CBC WITH DIFFERENTIAL/PLATELET
Basophils Absolute: 0 10*3/uL (ref 0.0–0.1)
Basophils Relative: 0.3 % (ref 0.0–3.0)
EOS ABS: 0.1 10*3/uL (ref 0.0–0.7)
Eosinophils Relative: 0.7 % (ref 0.0–5.0)
HEMATOCRIT: 44.6 % (ref 39.0–52.0)
Hemoglobin: 15.3 g/dL (ref 13.0–17.0)
Lymphocytes Relative: 25 % (ref 12.0–46.0)
Lymphs Abs: 1.8 10*3/uL (ref 0.7–4.0)
MCHC: 34.4 g/dL (ref 30.0–36.0)
MCV: 87.2 fl (ref 78.0–100.0)
Monocytes Absolute: 0.6 10*3/uL (ref 0.1–1.0)
Monocytes Relative: 7.7 % (ref 3.0–12.0)
Neutro Abs: 4.9 10*3/uL (ref 1.4–7.7)
Neutrophils Relative %: 66.3 % (ref 43.0–77.0)
Platelets: 149 10*3/uL — ABNORMAL LOW (ref 150.0–400.0)
RBC: 5.12 Mil/uL (ref 4.22–5.81)
RDW: 13.4 % (ref 11.5–15.5)
WBC: 7.4 10*3/uL (ref 4.0–10.5)

## 2018-10-31 LAB — VITAMIN D 25 HYDROXY (VIT D DEFICIENCY, FRACTURES): VITD: 24.12 ng/mL — AB (ref 30.00–100.00)

## 2018-10-31 LAB — LIPID PANEL
Cholesterol: 192 mg/dL (ref 0–200)
HDL: 54.5 mg/dL (ref >39.00)
LDL Cholesterol: 106 mg/dL — ABNORMAL HIGH (ref 0–99)
NonHDL: 137.47
Total CHOL/HDL Ratio: 4
Triglycerides: 159 mg/dL — ABNORMAL HIGH (ref 0.0–149.0)
VLDL: 31.8 mg/dL (ref 0.0–40.0)

## 2018-10-31 LAB — VITAMIN B12: VITAMIN B 12: 445 pg/mL (ref 211–911)

## 2018-10-31 LAB — TSH: TSH: 1.23 u[IU]/mL (ref 0.35–4.50)

## 2018-10-31 LAB — HEMOGLOBIN A1C: HEMOGLOBIN A1C: 5.6 % (ref 4.6–6.5)

## 2018-10-31 MED ORDER — CHOLECALCIFEROL 1.25 MG (50000 UT) PO TABS: ORAL_TABLET | ORAL | 0 refills | Status: DC

## 2018-11-05 ENCOUNTER — Encounter: Payer: BC Managed Care – PPO | Admitting: Family Medicine

## 2018-11-07 ENCOUNTER — Encounter: Payer: Self-pay | Admitting: Family Medicine

## 2018-11-07 ENCOUNTER — Telehealth: Payer: Self-pay

## 2018-11-07 NOTE — Telephone Encounter (Signed)
Dan [name], can you please fill out the form to have this addended.

## 2018-11-07 NOTE — Telephone Encounter (Signed)
Per Dr. Durene Cal -  Team- not sure when diabetes was entered as a diagnosis for his A1c-please change this to hyperglycemia- you may need Lea's help or labs help. This is one reason I recommend labs be done at time of visit due to potential for errors like this.   Forwarding to Lea to make change.

## 2018-11-13 NOTE — Telephone Encounter (Signed)
Ok.  Information added.

## 2018-11-13 NOTE — Patient Instructions (Signed)
Health Maintenance Due  Topic Date Due  . URINE MICROALBUMIN  07/15/1990    No flowsheet data found.

## 2018-12-28 ENCOUNTER — Encounter: Payer: Self-pay | Admitting: Family Medicine

## 2019-01-17 ENCOUNTER — Other Ambulatory Visit: Payer: Self-pay | Admitting: Family Medicine

## 2019-01-28 ENCOUNTER — Other Ambulatory Visit: Payer: Self-pay

## 2019-01-28 ENCOUNTER — Encounter: Payer: Self-pay | Admitting: Family Medicine

## 2019-01-28 ENCOUNTER — Ambulatory Visit (INDEPENDENT_AMBULATORY_CARE_PROVIDER_SITE_OTHER): Payer: BC Managed Care – PPO | Admitting: Family Medicine

## 2019-01-28 VITALS — BP 121/80 | HR 69 | Temp 98.4°F | Ht 70.0 in | Wt 177.4 lb

## 2019-01-28 DIAGNOSIS — E559 Vitamin D deficiency, unspecified: Secondary | ICD-10-CM | POA: Diagnosis not present

## 2019-01-28 DIAGNOSIS — Z0000 Encounter for general adult medical examination without abnormal findings: Secondary | ICD-10-CM

## 2019-01-28 DIAGNOSIS — Z87898 Personal history of other specified conditions: Secondary | ICD-10-CM | POA: Diagnosis not present

## 2019-01-28 LAB — VITAMIN D 25 HYDROXY (VIT D DEFICIENCY, FRACTURES): VITD: 65.91 ng/mL (ref 30.00–100.00)

## 2019-01-28 NOTE — Patient Instructions (Addendum)
Please stop by lab before you go If you do not have mychart- we will call you about results within 5 business days of [place] receiving them.  If you have mychart- we will send your results within 3 business days of [place] receiving them.  If abnormal or we want to clarify a result, we will call or mychart you to make sure you receive the message.  If you have questions or concerns or don't hear within 5-7 days, please send [place] a message or call us.   Consider counseling to hopefully help with anxiety/sleep

## 2019-01-28 NOTE — Progress Notes (Signed)
Phone: [phone]    Subjective:  Patient presents today for their annual physical. Chief complaint-noted.   See problem oriented charting- ROS- full  review of systems was completed and negative except for: sleep disturbance  The following were reviewed and entered/updated in epic: Past Medical History:  Diagnosis Date  . Blood transfusion without reported diagnosis    HIV and Hep C negative-early 90s, late 2980s, pt report. after tonsilectomy  . Hypertension    Patient Active Problem List   Diagnosis Date Noted  . Health care maintenance 09/16/2012    Priority: Low  . Hypertension 02/15/2012    Priority: Low   Past Surgical History:  Procedure Laterality Date  . TONSILLECTOMY     post op bleeding (pt report)  . WISDOM TOOTH EXTRACTION      Family History  Problem Relation Age of Onset  . Thyroid disease Mother   . Hyperlipidemia Father   . Hypertension Father   . Polycystic kidney disease Father   . Diabetes Paternal Grandfather        paternal grandmother  . Kidney disease Paternal Grandfather     Medications- reviewed and updated Current Outpatient Medications  Medication Sig Dispense Refill  . Cholecalciferol 1.25 MG (50000 UT) TABS 50,000 units PO qwk for 12 weeks. 12 tablet 0   No current facility-administered medications for this visit.     Allergies-reviewed and updated Allergies  Allergen Reactions  . Amoxicillin     REACTION: as child unspecified    Social History   Social History Narrative   Married (wife with just obgyn), Daughter charlotte born 2019. 1 Yorkie- other passed 2019.       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       Hobbies: golfing, boating, travel      Objective:  BP 121/80 (BP Location: Left Arm, Patient Position: Sitting, Cuff Size: Normal)   Pulse 69   Temp 98.4 F (36.9 C) (Oral)   Ht 5\' 10"  (1.778 m)   Wt 177 lb 6.1 oz (80.5  kg)   SpO2 99%   BMI 25.45 kg/m  Gen: NAD, resting comfortably HEENT: Mucous membranes are moist. Oropharynx normal Neck: no thyromegaly or cervical lymphadenopathy  CV: RRR no murmurs rubs or gallops Lungs: CTAB no crackles, wheeze, rhonchi Abdomen: soft/nontender/nondistended/normal bowel sounds. No rebound or guarding.  Ext: no edema Skin: warm, dry Neuro: grossly normal, moves all extremities, PERRLA     Assessment and Plan:  38 y.o. male presenting for annual physical.  Health Maintenance counseling: 1. Anticipatory guidance: Patient counseled regarding regular dental exams -q6 months, eye exams - yearly,  avoiding smoking and second hand smoke , limiting alcohol to 2 beverages per day.   2. Risk factor reduction:  Advised patient of need for regular exercise and diet rich and fruits and vegetables to reduce risk of heart attack and stroke. Exercise- doing well until covid 19- hasnt been able to get to gym so doing some stuff at home including ourdoors. Diet-reasonably healthy diet. .  Wt Readings from Last 3 Encounters:  01/28/19 177 lb 6.1 oz (80.5 kg)  04/04/16 168 lb 6.4 oz (76.4 kg)  12/03/15 169 lb (76.7 kg)  3. Immunizations/screenings/ancillary studies- up to date Immunization History  Administered Date(s) Administered  . Influenza Split 05/15/2012  . Influenza, Seasonal, Injecte, Preservative Fre 06/03/2014  . Influenza,inj,Quad PF,6+ Mos 05/13/2018  . Influenza-Unspecified 06/26/2017  . Tdap 10/11/2010   4.  Prostate cancer screening- no family history, start at age 51   5. Colon cancer screening - no family history, start at age 63 6. Skin cancer screening/prevention- intermittent dermatology visits. advised regular sunscreen use. Denies worrisome, changing, or new skin lesions.  7. Testicular cancer screening- advised monthly self exams  8. STD screening- patient opts out as monogomous 9. Never smoker-   Status of chronic or acute concerns   # vitamin D  deficiency- he started multivitamin after prior vitamin D boost for 12 weeks in march -update vitamin D today  # will try go go back and change a1c association to hyperglycemia from march 2020- this was done in error by my team- should have been hyperglycemia. No diabetes history  #history of fever Back in february patient treated with tamiflu as daughter had the flu- he spiked a fever despite being on tamiflu- fever gone next day and then he had cough for 3 weeks. At crossfit was told tough to breath.   # patient is taking unisom for sleep for last 3 nights and not able to sleep (10:30 to 12 30 then up on the couch a lot). Admits to higher stress levels - primarily work related- and contributing to this. Doing unisom 1-2 nights a week prior to recently when has been taking nightly. Diagnosed with anxiety when younger. Has done melatonin in past- used 5 mg dose.  -refer to counseling today as looking for non med based treatment -could also try 2-3 mg melatonin instad of 5-10mg   Lab/Order associations:not fasting    ICD-10-CM   1. Preventative health care  Z00.00 VITAMIN D 25 Hydroxy (Vit-D Deficiency, Fractures)    SAR CoV2 Serology (COVID 19)AB(IGG)IA  2. Vitamin D deficiency  E55.9 VITAMIN D 25 Hydroxy (Vit-D Deficiency, Fractures)  3. History of fever  Z87.898 SAR CoV2 Serology (COVID 19)AB(IGG)IA    No orders of the defined types were placed in this encounter.   Return precautions advised.   Garret Reddish, MD

## 2019-01-29 LAB — SAR COV2 SEROLOGY (COVID19)AB(IGG),IA: SARS CoV2 AB IGG: NEGATIVE

## 2019-03-07 ENCOUNTER — Encounter: Payer: Self-pay | Admitting: Family Medicine

## 2019-03-08 ENCOUNTER — Encounter: Payer: Self-pay | Admitting: *Deleted

## 2019-03-08 ENCOUNTER — Ambulatory Visit: Payer: BC Managed Care – PPO | Admitting: Family Medicine

## 2019-03-09 ENCOUNTER — Ambulatory Visit (INDEPENDENT_AMBULATORY_CARE_PROVIDER_SITE_OTHER): Payer: BC Managed Care – PPO | Admitting: Family Medicine

## 2019-03-09 ENCOUNTER — Encounter: Payer: Self-pay | Admitting: Family Medicine

## 2019-03-09 ENCOUNTER — Other Ambulatory Visit: Payer: Self-pay

## 2019-03-09 DIAGNOSIS — B360 Pityriasis versicolor: Secondary | ICD-10-CM

## 2019-03-09 NOTE — Progress Notes (Signed)
Virtual Visit via Video Note  I connected with pt on 03/09/19 at  9:40 AM EDT by a video enabled telemedicine application and verified that I am speaking with the correct person using two identifiers.  Location patient: home Location provider:work or home office Persons participating in the virtual visit: patient, provider  I discussed the limitations of evaluation and management by telemedicine and the availability of in person appointments. The patient expressed understanding and agreed to proceed.  Telemedicine visit is a necessity given the COVID-19 restrictions in place at the current time.  HPI: 38 y/o male being seen today for "spot on neck he wants looked at". Onset 2 wks ago of  doscoloration of skin on his neck, skin lighter in a large patch on back of neck on left. No itching.  It is a bit flaky.  The area of lighter skin turns mildly pink when he is in the sun a lot, then fades back to more of a hypopigmented color of skin after inside a while.  No pain in the area.  No other areas on body with similar appearance.   ROS: See pertinent positives and negatives per HPI.  Past Medical History:  Diagnosis Date  . 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 after tonsilectomy  . Hypertension     Past Surgical History:  Procedure Laterality Date  . TONSILLECTOMY     post op bleeding (pt report)  . WISDOM TOOTH EXTRACTION      Family History  Problem Relation Age of Onset  . Thyroid disease Mother   . Hyperlipidemia Father   . Hypertension Father   . Polycystic kidney disease Father   . Diabetes Paternal Grandfather        paternal grandmother  . Kidney disease Paternal Grandfather    Social History   Socioeconomic History  . Marital status: Married    Spouse name: Not on file  . Number of children: Not on file  . Years of education: Not on file  . Highest education level: Not on file  Occupational  History  . Not on file  Social Needs  . Financial resource strain: Not on file  . Food insecurity    Worry: Not on file    Inability: Not on file  . Transportation needs    Medical: Not on file    Non-medical: Not on file  Tobacco Use  . Smoking status: Never Smoker  . Smokeless tobacco: Never Used  Substance and Sexual Activity  . Alcohol use: Yes    Alcohol/week: 0.0 - 2.0 standard drinks  . Drug use: No  . Sexual activity: Yes    Partners: Female  Lifestyle  . Physical activity    Days per week: Not on file    Minutes per session: Not on file  . Stress: Not on file  Relationships  . Social Herbalist on phone: Not on file    Gets together: Not on file    Attends religious service: Not on file    Active member of club or organization: Not on file    Attends meetings of clubs or organizations: Not on file    Relationship status: Not on file  Other Topics Concern  . Not on file  Social History Narrative   Married (wife with just obgyn), Daughter charlotte born 2019. 1 Yorkie- other passed 2019.       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       Hobbies: golfing, boating, travel    Current Outpatient Medications:  .  Cholecalciferol 1.25 MG (50000 UT) TABS, 50,000 units PO qwk for 12 weeks., Disp: 12 tablet, Rfl: 0  EXAM:  VITALS per patient if applicable:There were no vitals taken for this visit.  GENERAL: alert, oriented, appears well and in no acute distress  HEENT: atraumatic, conjunttiva clear, no obvious abnormalities on inspection of external nose and ears  NECK: normal movements of the head and neck Back of neck on L he has an irregular shaped hypopigmented area of skin about 8-10 cm diameter, borders pretty well demarcated.  On computer screen image it is impossible to tell if any superficial desquamation.  No papules, pustules, vesicles, or hives. No  erythema.  LUNGS: on inspection no signs of respiratory distress, breathing rate appears normal, no obvious gross SOB, gasping or wheezing  CV: no obvious cyanosis  MS: moves all visible extremities without noticeable abnormality  PSYCH/NEURO: pleasant and cooperative, no obvious depression or anxiety, speech and thought processing grossly intact  LABS: none today    Chemistry      Component Value Date/Time   NA 140 10/31/2018 0821   K 4.4 10/31/2018 0821   CL 103 10/31/2018 0821   CO2 31 10/31/2018 0821   BUN 17 10/31/2018 0821   CREATININE 1.01 10/31/2018 0821      Component Value Date/Time   CALCIUM 9.4 10/31/2018 0821   ALKPHOS 40 10/31/2018 0821   AST 16 10/31/2018 0821   ALT 17 10/31/2018 0821   BILITOT 0.9 10/31/2018 0821     Lab Results  Component Value Date   WBC 7.4 10/31/2018   HGB 15.3 10/31/2018   HCT 44.6 10/31/2018   MCV 87.2 10/31/2018   PLT 149.0 (L) 10/31/2018   ASSESSMENT AND PLAN:  Discussed the following assessment and plan:  Tinea versicolor: recommended otc generic lamisil cream bid x 2-3 wks.   I discussed the assessment and treatment plan with the patient. The patient was provided an opportunity to ask questions and all were answered. The patient agreed with the plan and demonstrated an understanding of the instructions.   The patient was advised to call back or seek an in-person evaluation if the symptoms worsen or if the condition fails to improve as anticipated.  F/u: if not improving.  Signed:  Santiago BumpersPhil Jaemarie Hochberg, MD           03/09/2019

## 2019-07-18 ENCOUNTER — Telehealth: Payer: Self-pay | Admitting: Family Medicine

## 2019-07-18 MED ORDER — OSELTAMIVIR PHOSPHATE 75 MG PO CAPS: 75.0000 mg | ORAL_CAPSULE | Freq: Every day | ORAL | 0 refills | Status: DC

## 2019-07-18 NOTE — Telephone Encounter (Signed)
Patient's Daughter tested positive for the flu. Patient is requesting a script for preventative Tamiflu.  Cb- [phone] Preferred Pharmacy-[facility]

## 2019-07-18 NOTE — Telephone Encounter (Signed)
See below

## 2019-07-18 NOTE — Telephone Encounter (Signed)
I have sent this in for him-please let him know

## 2019-07-18 NOTE — Telephone Encounter (Signed)
See note

## 2019-07-19 NOTE — Telephone Encounter (Signed)
Called patient, no answer. Left message for patient to return my call. 

## 2019-07-22 NOTE — Telephone Encounter (Signed)
Called patient back. Pt picked up medication from pharmacy.

## 2019-10-13 ENCOUNTER — Ambulatory Visit: Payer: BC Managed Care – PPO

## 2020-01-30 ENCOUNTER — Encounter: Payer: BC Managed Care – PPO | Admitting: Family Medicine

## 2020-02-25 ENCOUNTER — Ambulatory Visit: Payer: BC Managed Care – PPO | Admitting: Family Medicine

## 2020-02-25 ENCOUNTER — Encounter: Payer: Self-pay | Admitting: Family Medicine

## 2020-02-25 ENCOUNTER — Other Ambulatory Visit: Payer: Self-pay

## 2020-02-25 VITALS — BP 138/88 | HR 88 | Temp 98.0°F | Ht 70.0 in | Wt 188.0 lb

## 2020-02-25 DIAGNOSIS — E559 Vitamin D deficiency, unspecified: Secondary | ICD-10-CM | POA: Diagnosis not present

## 2020-02-25 DIAGNOSIS — Z1159 Encounter for screening for other viral diseases: Secondary | ICD-10-CM | POA: Diagnosis not present

## 2020-02-25 DIAGNOSIS — E785 Hyperlipidemia, unspecified: Secondary | ICD-10-CM | POA: Diagnosis not present

## 2020-02-25 DIAGNOSIS — Z21 Asymptomatic human immunodeficiency virus [HIV] infection status: Secondary | ICD-10-CM

## 2020-02-25 DIAGNOSIS — Z833 Family history of diabetes mellitus: Secondary | ICD-10-CM

## 2020-02-25 DIAGNOSIS — Z0000 Encounter for general adult medical examination without abnormal findings: Secondary | ICD-10-CM | POA: Diagnosis not present

## 2020-02-25 LAB — COMPREHENSIVE METABOLIC PANEL
ALT: 23 U/L (ref 0–53)
AST: 20 U/L (ref 0–37)
Albumin: 4.9 g/dL (ref 3.5–5.2)
Alkaline Phosphatase: 43 U/L (ref 39–117)
BUN: 19 mg/dL (ref 6–23)
CO2: 28 mEq/L (ref 19–32)
Calcium: 9.5 mg/dL (ref 8.4–10.5)
Chloride: 101 mEq/L (ref 96–112)
Creatinine, Ser: 1.03 mg/dL (ref 0.40–1.50)
GFR: 80.14 mL/min (ref >60.00)
Glucose, Bld: 102 mg/dL — ABNORMAL HIGH (ref 70–99)
Potassium: 3.9 mEq/L (ref 3.5–5.1)
Sodium: 139 mEq/L (ref 135–145)
Total Bilirubin: 0.7 mg/dL (ref 0.2–1.2)
Total Protein: 7.2 g/dL (ref 6.0–8.3)

## 2020-02-25 LAB — CBC WITH DIFFERENTIAL/PLATELET
Basophils Absolute: 0 10*3/uL (ref 0.0–0.1)
Basophils Relative: 0.3 % (ref 0.0–3.0)
Eosinophils Absolute: 0 10*3/uL (ref 0.0–0.7)
Eosinophils Relative: 0.4 % (ref 0.0–5.0)
HCT: 44.3 % (ref 39.0–52.0)
Hemoglobin: 15.4 g/dL (ref 13.0–17.0)
Lymphocytes Relative: 22.2 % (ref 12.0–46.0)
Lymphs Abs: 1.8 10*3/uL (ref 0.7–4.0)
MCHC: 34.9 g/dL (ref 30.0–36.0)
MCV: 86.4 fl (ref 78.0–100.0)
Monocytes Absolute: 0.8 10*3/uL (ref 0.1–1.0)
Monocytes Relative: 9.8 % (ref 3.0–12.0)
Neutro Abs: 5.4 10*3/uL (ref 1.4–7.7)
Neutrophils Relative %: 67.3 % (ref 43.0–77.0)
Platelets: 137 10*3/uL — ABNORMAL LOW (ref 150.0–400.0)
RBC: 5.13 Mil/uL (ref 4.22–5.81)
RDW: 13.4 % (ref 11.5–15.5)
WBC: 8.1 10*3/uL (ref 4.0–10.5)

## 2020-02-25 LAB — HEMOGLOBIN A1C: Hgb A1c MFr Bld: 5.5 % (ref 4.6–6.5)

## 2020-02-25 LAB — LIPID PANEL
Cholesterol: 183 mg/dL (ref 0–200)
HDL: 44.2 mg/dL (ref >39.00)
NonHDL: 138.73
Total CHOL/HDL Ratio: 4
Triglycerides: 300 mg/dL — ABNORMAL HIGH (ref 0.0–149.0)
VLDL: 60 mg/dL — ABNORMAL HIGH (ref 0.0–40.0)

## 2020-02-25 LAB — VITAMIN D 25 HYDROXY (VIT D DEFICIENCY, FRACTURES): VITD: 28.82 ng/mL — ABNORMAL LOW (ref 30.00–100.00)

## 2020-02-25 LAB — LDL CHOLESTEROL, DIRECT: Direct LDL: 103 mg/dL

## 2020-02-25 NOTE — Patient Instructions (Addendum)
Please stop by lab before you go If you have mychart- we will send your results within 3 business days of us receiving them.  If you do not have mychart- we will call you about results within 5 business days of us receiving them.    

## 2020-02-25 NOTE — Progress Notes (Signed)
Phone: [phone]    Subjective:  Patient presents today for their annual physical. Chief complaint-noted.   See problem oriented charting- Review of Systems  Constitutional: Negative for chills, diaphoresis, fever, malaise/fatigue (baseline level of fatigue as a prinicipal) and weight loss.  HENT: Negative for ear pain, hearing loss and tinnitus.   Eyes: Negative for blurred vision and double vision.  Respiratory: Negative for cough and shortness of breath.   Cardiovascular: Negative for chest pain and palpitations.  Gastrointestinal: Negative for constipation, diarrhea, nausea and vomiting.  Genitourinary: Negative for dysuria and frequency.  Musculoskeletal: Negative for back pain, joint pain and myalgias.  Skin: Negative for itching and rash.  Neurological: Negative for dizziness, seizures, weakness and headaches.  Endo/Heme/Allergies: Negative for polydipsia. Does not bruise/bleed easily.  Psychiatric/Behavioral: Negative for depression, hallucinations, substance abuse and suicidal ideas.    The following were reviewed and entered/updated in epic: Past Medical History:  Diagnosis Date  . 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 after tonsilectomy  . Hypertension    Patient Active Problem List   Diagnosis Date Noted  . Health care maintenance 09/16/2012    Priority: Low  . Hypertension 02/15/2012    Priority: Low  . Vitamin D deficiency 02/25/2020   Past Surgical History:  Procedure Laterality Date  . TONSILLECTOMY     post op bleeding (pt report)  . WISDOM TOOTH EXTRACTION      Family History  Problem Relation Age of Onset  . Thyroid disease Mother   . Hyperlipidemia Father   . Hypertension Father   . Polycystic kidney disease Father   . Diabetes Paternal Grandfather        paternal grandmother  . Kidney disease Paternal Grandfather     Medications- reviewed and updated Current Outpatient  Medications  Medication Sig Dispense Refill  . Multiple Vitamin (MULTIVITAMIN ADULT PO) Take by mouth.     No current facility-administered medications for this visit.    Allergies-reviewed and updated Allergies  Allergen Reactions  . Amoxicillin     REACTION: as child unspecified    Social History   Social History Narrative   Married (wife with just obgyn), Daughter charlotte born 2019. 1 Yorkie- other passed 2019.       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       Hobbies: golfing, boating, travel      Objective:  BP 138/88   Pulse 88   Temp 98 F (36.7 C)   Ht 5\' 10"  (1.778 m)   Wt 188 lb (85.3 kg)   SpO2 96%   BMI 26.98 kg/m  Gen: NAD, resting comfortably HEENT: Mucous membranes are moist. Oropharynx normal Neck: no thyromegaly CV: RRR no murmurs rubs or gallops Lungs: CTAB no crackles, wheeze, rhonchi Abdomen: soft/nontender/nondistended/normal bowel sounds. No rebound or guarding.  Ext: no edema Skin: warm, dry Neuro: grossly normal, moves all extremities, PERRLA     Assessment and Plan:  39 y.o. male presenting for annual physical.  Health Maintenance counseling: 1. Anticipatory guidance: Patient counseled regarding regular dental exams q6 months, eye exams- yearly for contacts ,  avoiding smoking and second hand smoke, limiting alcohol to 2 beverages per day.   2. Risk factor reduction:  Advised patient of need for regular exercise and diet rich and fruits and vegetables to reduce risk of heart attack and stroke. Exercise- 5-6 days a week.  Diet-eats out and at home- diet has slipped some and weight up slightly during pandemic.  Wt Readings from Last 3 Encounters:  02/25/20 188 lb (85.3 kg)  01/28/19 177 lb 6.1 oz (80.5 kg)  04/04/16 168 lb 6.4 oz (76.4 kg)  3. Immunizations/screenings/ancillary studies- due for tdap next year Immunization History  Administered Date(s)  Administered  . Influenza Split 05/15/2012  . Influenza, Seasonal, Injecte, Preservative Fre 06/03/2014  . Influenza,inj,Quad PF,6+ Mos 05/13/2018, 05/26/2019  . Influenza-Unspecified 06/26/2017  . Moderna SARS-COVID-2 Vaccination 10/18/2019, 11/08/2019  . Tdap 10/11/2010   4. Prostate cancer screening- no family history, start at age 16  5. Colon cancer screening - no family history, start at age 32 6. Skin cancer screening/prevention- saw dermatology within a few years- considering getting plugged back in. advised regular sunscreen use. Denies worrisome, changing, or new skin lesions.  7. Testicular cancer screening- advised monthly self exams  8. STD screening/concern for false positive HIV test- seeing Dr. April Manson for fertility.  He had a positive HIV test-we are attempting to get results-release of information signed.  He states a confirmatory test was also positive but we are not sure which one.Tested negative HIV in 2016. Only thing since 2016 other than 1 tattoo is vitamin and covid 19 shot. Wife is negative. They do not use protection. Other std testing was negative. Possible risk factors: A. monogomous for course of marriage- only person he has ever been with-practice abstinence.  He is not concerned about fidelity on wife's side B.   Never got stuck with a needle outside of medical setting other than-Has tattoos with last in 2018.   C. No IV drug use.  D. No bloodborne contact with students 9. Never smoker-   Status of chronic or acute concerns   #Family history of diabetes Diabetes with erroneously listed as reason for hemoglobin A1c last year-we altered this to family history of diabetes but alerts are still recommending urine microalbumin-I recommended we not complete this and I discontinued this -Update A1c with labs Lab Results  Component Value Date   HGBA1C 5.6 10/31/2018   #hyperlipidemia S: Medication: None Lab Results  Component Value Date   CHOL 192 10/31/2018    HDL 54.50 10/31/2018   LDLCALC 106 (H) 10/31/2018   TRIG 159.0 (H) 10/31/2018   CHOLHDL 4 10/31/2018   A/P: Doubt patient will need statin as long as LDL under 190-update lipids with labs   #Vitamin D deficiency-update vitamin D with labs today  Recommended follow up:  1 year physical unless abnormalities on bloodwork  Lab/Order associations: not fasting   ICD-10-CM   1. Preventative health care  Z00.00 Hepatitis C Antibody    CBC with Differential/Platelet    Comprehensive metabolic panel    Lipid panel    Hemoglobin A1c    VITAMIN D 25 Hydroxy (Vit-D Deficiency, Fractures)  2. Vitamin D deficiency  E55.9 VITAMIN D 25 Hydroxy (Vit-D Deficiency, Fractures)  3. Family history of diabetes mellitus  Z83.3 Hemoglobin A1c  4. Encounter for hepatitis C screening test for low risk patient  Z11.59 Hepatitis C Antibody  5. Hyperlipidemia, unspecified hyperlipidemia type  E78.5 CBC with Differential/Platelet    Comprehensive metabolic panel    Lipid panel  6. HIV test positive (HCC)  Z21 HIV-1 RNA ultraquant reflex to gentyp+    HIV antibody (with reflex)    T-helper cells (CD4) count   Return precautions advised.  Tana Conch, MD

## 2020-02-27 ENCOUNTER — Telehealth: Payer: Self-pay | Admitting: Family Medicine

## 2020-02-27 NOTE — Telephone Encounter (Signed)
Patient is calling in this morning asking to speak with someone about his results yesterday.

## 2020-02-27 NOTE — Telephone Encounter (Signed)
Pt called back, he said the state called him about the positive HIV test he had done at another office and wanted to know if other test was back and how to get the false positive off his record. Told pt when the other test comes back we will let you know and I am not sure how to get off record Dr.Hunter may be able to send letter or something. Pt verbalized understanding and said we can discuss once last test comes back. Told him okay.

## 2020-02-27 NOTE — Telephone Encounter (Signed)
Left message on voicemail to call office.  

## 2020-03-03 ENCOUNTER — Encounter: Payer: Self-pay | Admitting: Family Medicine

## 2020-03-04 LAB — HIV-1 RNA ULTRAQUANT REFLEX TO GENTYP+
HIV 1 RNA Quant: <20 Copies/mL
HIV-1 RNA Quant, Log: <1.3 Log cps/mL

## 2020-03-04 LAB — HEPATITIS C ANTIBODY
Hepatitis C Ab: NONREACTIVE
SIGNAL TO CUT-OFF: 0.01 (ref <1.00)

## 2020-03-04 LAB — T-HELPER CELLS (CD4) COUNT (NOT AT ARMC)
Absolute CD4: 671 cells/uL (ref 490–1740)
CD4 T Helper %: 39 % (ref 30–61)
Total lymphocyte count: 1731 cells/uL (ref 850–3900)

## 2020-03-04 LAB — HIV ANTIBODY (ROUTINE TESTING W REFLEX): HIV 1&2 Ab, 4th Generation: NONREACTIVE

## 2020-03-05 ENCOUNTER — Encounter: Payer: Self-pay | Admitting: Family Medicine

## 2020-03-06 ENCOUNTER — Other Ambulatory Visit: Payer: Self-pay

## 2020-03-08 ENCOUNTER — Encounter: Payer: Self-pay | Admitting: Family Medicine

## 2020-03-12 ENCOUNTER — Encounter: Payer: BC Managed Care – PPO | Admitting: Family Medicine

## 2020-08-18 ENCOUNTER — Other Ambulatory Visit: Payer: Self-pay

## 2020-08-18 DIAGNOSIS — Z20822 Contact with and (suspected) exposure to covid-19: Secondary | ICD-10-CM

## 2020-08-20 LAB — NOVEL CORONAVIRUS, NAA: SARS-CoV-2, NAA: NOT DETECTED

## 2020-08-20 LAB — SARS-COV-2, NAA 2 DAY TAT

## 2020-08-26 ENCOUNTER — Encounter: Payer: Self-pay | Admitting: Family Medicine

## 2020-08-27 ENCOUNTER — Telehealth (INDEPENDENT_AMBULATORY_CARE_PROVIDER_SITE_OTHER): Payer: BC Managed Care – PPO | Admitting: Family Medicine

## 2020-08-27 DIAGNOSIS — U071 COVID-19: Secondary | ICD-10-CM | POA: Diagnosis not present

## 2020-08-27 MED ORDER — BENZONATATE 100 MG PO CAPS: 100.0000 mg | ORAL_CAPSULE | Freq: Three times a day (TID) | ORAL | 0 refills | Status: DC | PRN

## 2020-08-27 NOTE — Patient Instructions (Signed)
  HOME CARE TIPS:   -I sent the medication(s) we discussed to your pharmacy: Meds ordered this encounter  Medications  . benzonatate (TESSALON PERLES) 100 MG capsule    Sig: Take 1 capsule (100 mg total) by mouth 3 (three) times daily as needed.    Dispense:  20 capsule    Refill:  0    -can use tylenol or aleve if needed for fevers, aches and pains per instructions  -can use nasal saline a few times per day if nasal congestion  -stop the Afrin after 3 days of use  -stay hydrated, drink plenty of fluids and eat small healthy meals - avoid dairy  -can take 1000 IU Vit D3 and Vit C 500mg  daily  -follow up with your doctor in 2-3 days unless improving and feeling better  -please stay home for a full 10 days since the onset of symptoms PLUS one day of no fever and feeling better.  It was nice to meet you today, and I really hope you are feeling better soon. I help [facility] out with telemedicine visits on Tuesdays and Thursdays and am available for visits on those days. If you have any concerns or questions following this visit please schedule a follow up visit with your Primary Care doctor or seek care at a local urgent care clinic to avoid delays in care.    Seek in person care promptly if your symptoms worsen, new concerns arise or you are not improving with treatment. Call 911 and/or seek emergency care if you symptoms are severe or life threatening.

## 2020-08-27 NOTE — Progress Notes (Signed)
Virtual Visit via Video Note  I connected with Dan [name]  on 08/27/20 at 10:00 AM EST by a video enabled telemedicine application and verified that I am speaking with the correct person using two identifiers.  Location patient: home, [place] Location provider:work or home office Persons participating in the virtual visit: patient, provider  I discussed the limitations of evaluation and management by telemedicine and the availability of in person appointments. The patient expressed understanding and agreed to proceed.   HPI:  Acute telemedicine visit for COVID19: -Onset: wife sick with covid in early January, yesterday he woke up with some symptoms, home test was positive -Symptoms include: nasal congestion, mild cough -Denies: fevers, body aches, sob, CP, NVD -Has tried: Vit D supplement, Vit C, musinex, afrin nasal spray -Pertinent past medical history: denies any - reports hx of HTN -Pertinent medication allergies: amoxicillin -COVID-19 vaccine status: vaccinated and boosted  ROS: See pertinent positives and negatives per HPI.  Past Medical History:  Diagnosis Date  . 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 after tonsilectomy  . Hypertension     Past Surgical History:  Procedure Laterality Date  . TONSILLECTOMY     post op bleeding (pt report)  . WISDOM TOOTH EXTRACTION       Current Outpatient Medications:  .  benzonatate (TESSALON PERLES) 100 MG capsule, Take 1 capsule (100 mg total) by mouth 3 (three) times daily as needed., Disp: 20 capsule, Rfl: 0 .  Multiple Vitamin (MULTIVITAMIN ADULT PO), Take by mouth., Disp: , Rfl:   EXAM:  VITALS per patient if applicable:  GENERAL: alert, oriented, appears well and in no acute distress  HEENT: atraumatic, conjunttiva clear, no obvious abnormalities on inspection of external nose and ears  NECK: normal movements of the head and neck  LUNGS: on inspection no signs  of respiratory distress, breathing rate appears normal, no obvious gross SOB, gasping or wheezing  CV: no obvious cyanosis  MS: moves all visible extremities without noticeable abnormality  PSYCH/NEURO: pleasant and cooperative, no obvious depression or anxiety, speech and thought processing grossly intact  ASSESSMENT AND PLAN:  Discussed the following assessment and plan:  COVID-19  -we discussed possible serious and likely etiologies, options for evaluation and workup, limitations of telemedicine visit vs in person visit, treatment, treatment risks and precautions. Pt prefers to treat via telemedicine empirically rather than in person at this moment.  Discussed treatment options, potential complications, isolation, transmission and precautions for COVID-19 illness.  Currently having very mild symptoms.  He denies any history of immune compromise and reports history of hypertension is fully resolved due to dietary and lifestyle changes.  Fully vaccinated and boosted.  He opted for symptomatic care with nasal saline, analgesics if needed, Tessalon for cough, prescription sent. Work/School slipped offered: declined Scheduled follow up with PCP offered: Agrees to follow-up if needed Advised to seek prompt follow-up video visit or in person care if worsening, new symptoms arise, or if is not improving with treatment.   I discussed the assessment and treatment plan with the patient. The patient was provided an opportunity to ask questions and all were answered. The patient agreed with the plan and demonstrated an understanding of the instructions.     Terressa Koyanagi, DO

## 2020-11-10 ENCOUNTER — Encounter: Payer: Self-pay | Admitting: Family Medicine

## 2020-11-10 DIAGNOSIS — Z0183 Encounter for blood typing: Secondary | ICD-10-CM

## 2020-11-12 ENCOUNTER — Other Ambulatory Visit: Payer: Self-pay

## 2020-11-12 ENCOUNTER — Other Ambulatory Visit (INDEPENDENT_AMBULATORY_CARE_PROVIDER_SITE_OTHER): Payer: BC Managed Care – PPO

## 2020-11-12 DIAGNOSIS — Z0183 Encounter for blood typing: Secondary | ICD-10-CM | POA: Diagnosis not present

## 2020-11-13 LAB — ABO AND RH

## 2020-11-17 ENCOUNTER — Encounter: Payer: Self-pay | Admitting: Family Medicine

## 2021-02-26 ENCOUNTER — Encounter: Payer: BC Managed Care – PPO | Admitting: Family Medicine

## 2021-03-01 ENCOUNTER — Encounter: Payer: Self-pay | Admitting: Family Medicine

## 2021-03-01 ENCOUNTER — Other Ambulatory Visit: Payer: Self-pay

## 2021-03-01 DIAGNOSIS — Z0000 Encounter for general adult medical examination without abnormal findings: Secondary | ICD-10-CM

## 2021-03-01 DIAGNOSIS — R5383 Other fatigue: Secondary | ICD-10-CM

## 2021-03-01 DIAGNOSIS — E785 Hyperlipidemia, unspecified: Secondary | ICD-10-CM

## 2021-03-05 ENCOUNTER — Other Ambulatory Visit (INDEPENDENT_AMBULATORY_CARE_PROVIDER_SITE_OTHER): Payer: BC Managed Care – PPO

## 2021-03-05 ENCOUNTER — Other Ambulatory Visit: Payer: Self-pay

## 2021-03-05 DIAGNOSIS — E785 Hyperlipidemia, unspecified: Secondary | ICD-10-CM

## 2021-03-05 DIAGNOSIS — R5383 Other fatigue: Secondary | ICD-10-CM

## 2021-03-05 DIAGNOSIS — Z0000 Encounter for general adult medical examination without abnormal findings: Secondary | ICD-10-CM | POA: Diagnosis not present

## 2021-03-05 LAB — COMPREHENSIVE METABOLIC PANEL
ALT: 39 U/L (ref 0–53)
AST: 20 U/L (ref 0–37)
Albumin: 4.6 g/dL (ref 3.5–5.2)
Alkaline Phosphatase: 33 U/L — ABNORMAL LOW (ref 39–117)
BUN: 12 mg/dL (ref 6–23)
CO2: 29 mEq/L (ref 19–32)
Calcium: 9.4 mg/dL (ref 8.4–10.5)
Chloride: 104 mEq/L (ref 96–112)
Creatinine, Ser: 1.05 mg/dL (ref 0.40–1.50)
GFR: 88.71 mL/min (ref >60.00)
Glucose, Bld: 97 mg/dL (ref 70–99)
Potassium: 4.3 mEq/L (ref 3.5–5.1)
Sodium: 143 mEq/L (ref 135–145)
Total Bilirubin: 1 mg/dL (ref 0.2–1.2)
Total Protein: 6.5 g/dL (ref 6.0–8.3)

## 2021-03-05 LAB — CBC WITH DIFFERENTIAL/PLATELET
Basophils Absolute: 0 10*3/uL (ref 0.0–0.1)
Basophils Relative: 0.6 % (ref 0.0–3.0)
Eosinophils Absolute: 0.1 10*3/uL (ref 0.0–0.7)
Eosinophils Relative: 1.4 % (ref 0.0–5.0)
HCT: 43.8 % (ref 39.0–52.0)
Hemoglobin: 15 g/dL (ref 13.0–17.0)
Lymphocytes Relative: 31.9 % (ref 12.0–46.0)
Lymphs Abs: 2.1 10*3/uL (ref 0.7–4.0)
MCHC: 34.4 g/dL (ref 30.0–36.0)
MCV: 87 fl (ref 78.0–100.0)
Monocytes Absolute: 0.5 10*3/uL (ref 0.1–1.0)
Monocytes Relative: 8 % (ref 3.0–12.0)
Neutro Abs: 3.8 10*3/uL (ref 1.4–7.7)
Neutrophils Relative %: 58.1 % (ref 43.0–77.0)
Platelets: 146 10*3/uL — ABNORMAL LOW (ref 150.0–400.0)
RBC: 5.03 Mil/uL (ref 4.22–5.81)
RDW: 13.1 % (ref 11.5–15.5)
WBC: 6.6 10*3/uL (ref 4.0–10.5)

## 2021-03-05 LAB — LIPID PANEL
Cholesterol: 207 mg/dL — ABNORMAL HIGH (ref 0–200)
HDL: 45.3 mg/dL (ref >39.00)
NonHDL: 162.06
Total CHOL/HDL Ratio: 5
Triglycerides: 271 mg/dL — ABNORMAL HIGH (ref 0.0–149.0)
VLDL: 54.2 mg/dL — ABNORMAL HIGH (ref 0.0–40.0)

## 2021-03-05 LAB — LDL CHOLESTEROL, DIRECT: Direct LDL: 128 mg/dL

## 2021-03-05 LAB — TESTOSTERONE: Testosterone: 204.37 ng/dL — ABNORMAL LOW (ref 300.00–890.00)

## 2021-03-12 ENCOUNTER — Ambulatory Visit (INDEPENDENT_AMBULATORY_CARE_PROVIDER_SITE_OTHER): Payer: BC Managed Care – PPO | Admitting: Family Medicine

## 2021-03-12 ENCOUNTER — Encounter: Payer: Self-pay | Admitting: Family Medicine

## 2021-03-12 ENCOUNTER — Other Ambulatory Visit: Payer: Self-pay

## 2021-03-12 VITALS — BP 130/86 | HR 71 | Temp 97.8°F | Ht 68.5 in | Wt 193.0 lb

## 2021-03-12 DIAGNOSIS — Z0000 Encounter for general adult medical examination without abnormal findings: Secondary | ICD-10-CM | POA: Diagnosis not present

## 2021-03-12 DIAGNOSIS — I10 Essential (primary) hypertension: Secondary | ICD-10-CM

## 2021-03-12 DIAGNOSIS — Z23 Encounter for immunization: Secondary | ICD-10-CM

## 2021-03-12 DIAGNOSIS — R5383 Other fatigue: Secondary | ICD-10-CM

## 2021-03-12 DIAGNOSIS — E559 Vitamin D deficiency, unspecified: Secondary | ICD-10-CM

## 2021-03-12 DIAGNOSIS — R7989 Other specified abnormal findings of blood chemistry: Secondary | ICD-10-CM

## 2021-03-12 NOTE — Progress Notes (Signed)
Phone: [phone]    Subjective:  Patient presents today for their annual physical. Chief complaint-noted.   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 after tonsilectomy   Hypertension    Patient Active Problem List   Diagnosis Date Noted   Health care maintenance 09/16/2012    Priority: Low   Hypertension 02/15/2012    Priority: Low   Vitamin D deficiency 02/25/2020   Past Surgical History:  Procedure Laterality Date   TONSILLECTOMY     post op bleeding (pt report)   WISDOM TOOTH EXTRACTION      Family History  Problem Relation Age of Onset   Thyroid disease Mother    Hyperlipidemia Father    Hypertension Father    Polycystic kidney disease Father    Diabetes Paternal Grandfather        paternal grandmother   Kidney disease Paternal Grandfather     Medications- reviewed and updated Current Outpatient Medications  Medication Sig Dispense Refill   Calcium Citrate (CITRACAL PO) Take 2 tablets by mouth daily.     fluticasone (FLONASE) 50 MCG/ACT nasal spray Place 1 spray into both nostrils daily as needed for allergies or rhinitis.     No current facility-administered medications for this visit.    Allergies-reviewed and updated Allergies  Allergen Reactions   Amoxicillin     REACTION: as child unspecified    Social History   Social History Narrative   Married (wife patient with Dr. Durene Cal), Daughter charlotte born 2019, son due November 2022. 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        Hobbies: golfing, boating, travel      Objective:  BP 130/86 (BP Location: Left Arm, Patient Position: Sitting, Cuff Size: Normal)   Pulse 71   Temp 97.8 F (36.6 C) (Temporal)   Ht 5' 8.5" (1.74 m)   Wt 193 lb (87.5 kg)   SpO2 96%   BMI 28.92 kg/m  Gen: NAD, resting comfortably HEENT: Mucous membranes are moist. Oropharynx normal Neck: no thyromegaly CV: RRR no murmurs rubs or gallops Lungs: CTAB no crackles, wheeze, rhonchi Abdomen: soft/nontender/nondistended/normal bowel sounds. No rebound or guarding.  Ext: no edema Skin: warm, dry Neuro: grossly normal, moves all extremities, PERRLA    Assessment and Plan:  40 y.o. male presenting for annual physical.  Health Maintenance counseling: 1. Anticipatory guidance: Patient counseled regarding regular dental exams -q6 months, eye exams - yearly,  avoiding smoking and second hand smoke- , limiting alcohol to 2 beverages per day.   2. Risk factor reduction:  Advised patient of need for regular exercise and diet rich and fruits and vegetables to reduce risk of heart attack and stroke. Exercise- 5-6 days a week crosfit. Diet-slipping some - not eating quite a sclean- weight up slightly -peak of 217 years ago and low of 169.  Discussed maintenance to slight weight loss Wt Readings from Last 3 Encounters:  03/12/21 193 lb (87.5 kg)  02/25/20 188 lb (85.3 kg)  01/28/19 177 lb 6.1 oz (80.5 kg)  3. Immunizations/screenings/ancillary studies-Tdap today  Immunization History  Administered Date(s) Administered   Influenza Split 05/15/2012   Influenza, Seasonal, Injecte, Preservative Fre 06/03/2014   Influenza,inj,Quad PF,6+ Mos 05/13/2018, 05/26/2019   Influenza-Unspecified 06/26/2017   Moderna Sars-Covid-2 Vaccination 10/18/2019, 11/08/2019, 06/05/2020   Tdap 10/11/2010   4. Prostate cancer screening- no family history, start at age 10  5. Colon cancer screening - no family history, start at age 51 6. Skin cancer  screening/prevention- saw dermatology 3 months ago- mole removed severe atypia- ok with yearly still. advised regular sunscreen use. Denies worrisome, changing, or new skin lesions.  7. Testicular cancer screening- advised monthly self exams  8. STD screening- patient opts out- monogamous- prior false positive HIV 9. Never smoker-   Status of chronic or acute concerns   #social update- son due in November. Big sister is excited. They have been through a lot with fertility - thrilled to hear this good news. Working as principal now!   #Family history of diabetes-A1c was well controlled on last few checks. Blood sugar normal at 97 so we do not need to do a1c today- consider if levels above 100 in furute Lab Results  Component Value Date   HGBA1C 5.5 02/25/2020   HGBA1C 5.6 10/31/2018   #hypertension S: medication: Prior losartan no significant weight loss and no longer requires Home readings #s: no recent checks BP Readings from Last 3 Encounters:  03/12/21 130/86  02/25/20 138/88  01/28/19 121/80  A/P: bp slightly higher on last 2 checks- would be worth checking a few days before next visit- I suspect home readings are even better.    #hyperlipidemia S: Medication: none  Lab Results  Component Value Date   CHOL 207 (H) 03/05/2021   HDL 45.30 03/05/2021   LDLCALC 106 (H) 10/31/2018   LDLDIRECT 128.0 03/05/2021   TRIG 271.0 (H) 03/05/2021   CHOLHDL 5 03/05/2021   A/P: 10-year ASCVD risk is 2.7% which is below threshold of 7.5% we used to consider cholesterol medicine-continue to work on healthy eating/regular exercise   #Vitamin D deficiency S: Medication:  otc vitamin D 2 pills a day- he thinks 2000 units Last vitamin D Lab Results  Component Value Date   VD25OH 28.82 (L) 02/25/2020  A/P: hopefully improved - we will recheck when we check testosterone   # Testosterone concern S:from mychart message "I would also like to have my testosterone levels checked.  I am very  curious if they are low now as I often feel very tired and I don't seem to have the same energy when working out.  I have read that needs to be checked in the mornings as well.  Just let me know and thank you for all you do."  -also feels like has made less progress with muscle growth and even noticed some weights dropping (some could be covid related but even harder to get back up) Lab Results  Component Value Date   TSH 1.23 10/31/2018  A/P: patient with low testosterone on initial labs- low enough that I suspect this may be real. Still need to confirm with free testosterone testing- we will schedule fasting labs between 8-9 am  -also check tsh with fatigue as well as fsh, lh with possible low testosterone  Recommended follow up: Return in about 1 year (around 03/12/2022) for physical or sooner if needed.  Lab/Order associations: will come back fasting   ICD-10-CM   1. Preventative health care  Z00.00     2. Need for  prophylactic vaccination with combined diphtheria-tetanus-pertussis (DTP) vaccine  Z23 Tdap vaccine greater than or equal to 7yo IM    3. Primary hypertension  I10     4. Vitamin D deficiency  E55.9 VITAMIN D 25 Hydroxy (Vit-D Deficiency, Fractures)    5. Fatigue, unspecified type  R53.83 TSH    6. Low testosterone  R79.89 LH    FSH    Testosterone Total,Free,Bio, Males-([facility])     Return precautions advised.  Tana Conch, MD

## 2021-03-12 NOTE — Patient Instructions (Addendum)
Tdap today  Schedule a lab visit at the check out desk within 2 weeks between 8-9 AM. Return for future fasting labs meaning nothing but water after midnight please. Ok to take your medications with water.   Recommended follow up: Return in about 1 year (around 03/12/2022) for physical or sooner if needed. Such as if testosterone is off

## 2021-03-26 ENCOUNTER — Other Ambulatory Visit (INDEPENDENT_AMBULATORY_CARE_PROVIDER_SITE_OTHER): Payer: BC Managed Care – PPO

## 2021-03-26 ENCOUNTER — Other Ambulatory Visit: Payer: Self-pay

## 2021-03-26 DIAGNOSIS — R7989 Other specified abnormal findings of blood chemistry: Secondary | ICD-10-CM

## 2021-03-26 DIAGNOSIS — E559 Vitamin D deficiency, unspecified: Secondary | ICD-10-CM | POA: Diagnosis not present

## 2021-03-26 DIAGNOSIS — R5383 Other fatigue: Secondary | ICD-10-CM

## 2021-03-26 LAB — VITAMIN D 25 HYDROXY (VIT D DEFICIENCY, FRACTURES): VITD: 24.95 ng/mL — ABNORMAL LOW (ref 30.00–100.00)

## 2021-03-26 LAB — FOLLICLE STIMULATING HORMONE: FSH: 3.9 m[IU]/mL (ref 1.4–18.1)

## 2021-03-26 LAB — TSH: TSH: 1.54 u[IU]/mL (ref 0.35–5.50)

## 2021-03-26 LAB — LUTEINIZING HORMONE: LH: 2.11 m[IU]/mL (ref 1.50–9.30)

## 2021-03-27 ENCOUNTER — Other Ambulatory Visit: Payer: Self-pay | Admitting: Family Medicine

## 2021-03-27 LAB — TESTOSTERONE TOTAL,FREE,BIO, MALES
Albumin: 4.5 g/dL (ref 3.6–5.1)
Sex Hormone Binding: 12 nmol/L (ref 10–50)
Testosterone: 163 ng/dL — ABNORMAL LOW (ref 250–827)

## 2021-03-27 MED ORDER — VITAMIN D (ERGOCALCIFEROL) 1.25 MG (50000 UNIT) PO CAPS: 50000.0000 [IU] | ORAL_CAPSULE | ORAL | 1 refills | Status: DC

## 2021-03-29 ENCOUNTER — Other Ambulatory Visit: Payer: Self-pay

## 2021-03-29 ENCOUNTER — Encounter: Payer: Self-pay | Admitting: Family Medicine

## 2021-03-29 DIAGNOSIS — R7989 Other specified abnormal findings of blood chemistry: Secondary | ICD-10-CM

## 2021-06-11 ENCOUNTER — Encounter: Payer: Self-pay | Admitting: Family Medicine

## 2021-06-18 ENCOUNTER — Encounter: Payer: Self-pay | Admitting: Internal Medicine

## 2021-06-18 ENCOUNTER — Other Ambulatory Visit: Payer: Self-pay

## 2021-06-18 ENCOUNTER — Ambulatory Visit: Payer: BC Managed Care – PPO | Admitting: Internal Medicine

## 2021-06-18 VITALS — BP 144/90 | HR 80 | Ht 68.5 in | Wt 199.4 lb

## 2021-06-18 DIAGNOSIS — E559 Vitamin D deficiency, unspecified: Secondary | ICD-10-CM

## 2021-06-18 DIAGNOSIS — R7989 Other specified abnormal findings of blood chemistry: Secondary | ICD-10-CM | POA: Diagnosis not present

## 2021-06-18 NOTE — Patient Instructions (Addendum)
-   Please return for fasting 8 AM  labs

## 2021-06-18 NOTE — Progress Notes (Addendum)
Name: Dan [name]  MRN/ DOB: [id number], August 17, 1980    Age/ Sex: 40 y.o., male    PCP: Shelva Majestic, MD   Reason for Endocrinology Evaluation: Hypogonadism     Date of Initial Endocrinology Evaluation: 06/18/2021     HPI: Mr. Dan [name] is a 40 y.o. male with a past medical history of HTN and dyslipidemia. The patient presented for initial endocrinology clinic visit on 06/18/2021 for consultative assistance with his hypogonadism.   Patient has been diagnosed with hypogonadism based on 2 low testosterone values of 204.37 NG/DL and 017 NG/dL and 12/23/2583 and 2/77/8242, this has been ordered based on patient request due to complaints of fatigue.    He continues with fatigue and difficulty lifting weights  Denies erectile dysfunction  Denies decrease libido  Has 3.5 yr biological child and his wife is expecting 2nd baby next week, per pt they had infertility issues due to decrease sperm mobility ( not count)  and decrease ovarian reserve in wife   He has never had testosterone replacement  NO OTC supplements  No Marijuana use, uses a CBD product for insomnia   No narcotic  meds   He denies decrease in shaving frequency   No fh of prostate cancer or CAD   He is on ergocalciferol 50,000 iu weekly  No definite plans about future conception      HISTORY:  Past Medical History:  Past Medical History:  Diagnosis Date   Blood transfusion without reported diagnosis    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 after tonsilectomy   Hypertension    Past Surgical History:  Past Surgical History:  Procedure Laterality Date   TONSILLECTOMY     post op bleeding (pt report)   WISDOM TOOTH EXTRACTION      Social History:  reports that he has never smoked. He has never used smokeless tobacco. He reports current alcohol use. He reports that he does not use drugs. Family History: family history includes Diabetes in his paternal grandfather;  Hyperlipidemia in his father; Hypertension in his father; Kidney disease in his paternal grandfather; Polycystic kidney disease in his father; Thyroid disease in his mother.   HOME MEDICATIONS: Allergies as of 06/18/2021       Reactions   Amoxicillin    REACTION: as child unspecified        Medication List        Accurate as of June 18, 2021  2:32 PM. If you have any questions, ask your nurse or doctor.          CITRACAL PO Take 2 tablets by mouth daily.   fluticasone 50 MCG/ACT nasal spray Commonly known as: FLONASE Place 1 spray into both nostrils daily as needed for allergies or rhinitis.   Vitamin D (Ergocalciferol) 1.25 MG (50000 UNIT) Caps capsule Commonly known as: DRISDOL Take 1 capsule (50,000 Units total) by mouth every 7 (seven) days.          REVIEW OF SYSTEMS: A comprehensive ROS was conducted with the patient and is negative except as per HPI     OBJECTIVE:  VS: BP (!) 144/90 (BP Location: Right Arm, Patient Position: Sitting, Cuff Size: Small)   Pulse 80   Ht 5' 8.5" (1.74 m)   Wt 199 lb 6.4 oz (90.4 kg)   SpO2 98%   BMI 29.88 kg/m    Wt Readings from Last 3 Encounters:  06/18/21 199 lb 6.4 oz (90.4 kg)  03/12/21 193 lb (87.5 kg)  02/25/20 188 lb (85.3 kg)     EXAM: General: Pt appears well and is in NAD  Hydration: Well-hydrated with moist mucous membranes and good skin turgor  Eyes: External eye exam normal without stare, lid lag or exophthalmos.  EOM intact.  PERRL.  Neck: General: Supple without adenopathy. Thyroid: Thyroid size normal.  No goiter or nodules appreciated.  Lungs: Clear with good BS bilat with no rales, rhonchi, or wheezes  Chest ; No gynecomastia   Heart: Auscultation: RRR.  Abdomen: Normoactive bowel sounds, soft, nontender, without masses or organomegaly palpable  Extremities: No LE edema   Genital : Testicular exam ,normal , with volume 25 mL bilaterally   Mental Status: Judgment, insight:  Intact Orientation: Oriented to time, place, and person Mood and affect: No depression, anxiety, or agitation     DATA REVIEWED:    Results for JAMIS, KRYDER" (MRN [id number]) as of 06/18/2021 14:03  Ref. Range 03/05/2021 08:40 03/26/2021 08:29  Testosterone Latest Ref Range: 250 - 827 ng/dL 384.66 (L) 599 (L)     Results for RITESH, OPARA" (MRN [id number]) as of 06/18/2021 14:03  Ref. Range 03/26/2021 08:29  VITD Latest Ref Range: 30.00 - 100.00 ng/mL 24.95 (L)   ASSESSMENT/PLAN/RECOMMENDATIONS:   Low testosterone :   - Clinically there's no S/S of testosterone deficiency  - I suspect this may be an essay interference, will proceed with repeat testing and include prolactin - We discussed side effects of testosterone therapy such as worsening Prostate volumes and serum PSA increase in response to testosterone treatment which might increase BPH and worsen urinary outflow obstruction as well as prostate cancer risk.  - We also discussed there is a possibility of increased cardiovascular risk associated with testosterone use. - I did recommend that if testosterone treatment is needed, I would suggest clomiphene as there are no definite plans about future conception and  testosterone will definitely lower his sperm count further .   2.  Muscle weakness:   -He has noted difficulty with weight lifting, his TFTs are normal, he has been noted with low vitamin D, explained to the patient this could be one of the causes for muscle weakness, he is currently on ergocalciferol weekly. I have advised the patient the importance of taking vitamin D to improve bone/muscle health. He was advised to switch to OTC 1000 IU daily once his  prescription has finished.    Follow-up pending lab results  Signed electronically by: Lyndle Herrlich, MD  [facility]  [facility] [address] [place], [place] [address] Phone:  [phone] FAX: [phone]   CC: Shelva Majestic, MD [address] [place] [address] Phone: [phone] Fax: [phone]   Return to Endocrinology clinic as below: Future Appointments  Date Time Provider Department Center  03/18/2022  8:00 AM Shelva Majestic, MD [facility]

## 2021-06-25 ENCOUNTER — Other Ambulatory Visit (INDEPENDENT_AMBULATORY_CARE_PROVIDER_SITE_OTHER): Payer: BC Managed Care – PPO

## 2021-06-25 ENCOUNTER — Other Ambulatory Visit: Payer: Self-pay

## 2021-06-25 DIAGNOSIS — R7989 Other specified abnormal findings of blood chemistry: Secondary | ICD-10-CM

## 2021-06-25 DIAGNOSIS — E559 Vitamin D deficiency, unspecified: Secondary | ICD-10-CM

## 2021-06-25 LAB — TSH: TSH: 0.9 u[IU]/mL (ref 0.35–5.50)

## 2021-06-25 LAB — PSA: PSA: 0.77 ng/mL (ref 0.10–4.00)

## 2021-06-25 LAB — T4, FREE: Free T4: 0.95 ng/dL (ref 0.60–1.60)

## 2021-06-25 LAB — LUTEINIZING HORMONE: LH: 1.56 m[IU]/mL (ref 1.50–9.30)

## 2021-06-25 LAB — VITAMIN D 25 HYDROXY (VIT D DEFICIENCY, FRACTURES): VITD: 32.24 ng/mL (ref 30.00–100.00)

## 2021-06-26 LAB — BETA HCG QUANT (REF LAB): hCG Quant: <1 m[IU]/mL (ref 0–3)

## 2021-06-27 LAB — PROLACTIN: Prolactin: 4.7 ng/mL (ref 2.0–18.0)

## 2021-06-27 LAB — TESTOSTERONE, TOTAL, LC/MS/MS: Testosterone, Total, LC-MS-MS: 218 ng/dL — ABNORMAL LOW (ref 250–1100)

## 2021-06-29 ENCOUNTER — Telehealth: Payer: Self-pay | Admitting: Internal Medicine

## 2021-06-29 DIAGNOSIS — R7989 Other specified abnormal findings of blood chemistry: Secondary | ICD-10-CM

## 2021-06-29 NOTE — Telephone Encounter (Signed)
Discussed lab results with the pt on 06/29/2021    Results for [name], [name]" (MRN [id number]) as of 06/29/2021 12:08  Ref. Range 06/25/2021 08:41  VITD Latest Ref Range: 30.00 - 100.00 ng/mL 32.24  LH Latest Ref Range: 1.50 - 9.30 mIU/mL 1.56  Prolactin Latest Ref Range: 2.0 - 18.0 ng/mL 4.7  hCG Quant Latest Ref Range: 0 - 3 mIU/mL <1  Testosterone, Total, LC-MS-MS Latest Ref Range: 250 - 1,100 ng/dL 937 (L)  TSH Latest Ref Range: 0.35 - 5.50 uIU/mL 0.90  T4,Free(Direct) Latest Ref Range: 0.60 - 1.60 ng/dL 1.69  PSA Latest Ref Range: 0.10 - 4.00 ng/mL 0.77     Pt with secondary hypogonadism, I did explain to him my concern with low-normal LH, he assures me of no history of OTC testosterone supplements or illicit use of drugs nor marijuana or narcotic use    Will proceed with pituitary images I will eventually recommend clomiphene use , which I did explain to him that this will be off label use

## 2021-07-21 ENCOUNTER — Telehealth: Payer: Self-pay | Admitting: Internal Medicine

## 2021-07-21 NOTE — Telephone Encounter (Signed)
Taron with [facility] ph# [phone] called re: Patient's insurance requires Prior Authorization by 07/23/21 in order for Patient to keep appointment on Saturday 07/24/21 for MRI

## 2021-07-24 ENCOUNTER — Other Ambulatory Visit: Payer: Self-pay

## 2021-07-24 ENCOUNTER — Ambulatory Visit
Admission: RE | Admit: 2021-07-24 | Discharge: 2021-07-24 | Disposition: A | Discharge status: Home / Self Care | Payer: BC Managed Care – PPO | Source: Ambulatory Visit | Attending: Internal Medicine | Admitting: Internal Medicine

## 2021-07-24 DIAGNOSIS — R7989 Other specified abnormal findings of blood chemistry: Secondary | ICD-10-CM

## 2021-07-24 MED ORDER — GADOBENATE DIMEGLUMINE 529 MG/ML IV SOLN
9.0000 mL | Freq: Once | INTRAVENOUS | Status: AC | PRN
  Administered 2021-07-24: 9 mL via INTRAVENOUS

## 2021-07-27 ENCOUNTER — Telehealth: Payer: Self-pay | Admitting: Internal Medicine

## 2021-07-27 ENCOUNTER — Encounter: Payer: Self-pay | Admitting: Internal Medicine

## 2021-07-27 MED ORDER — CLOMIPHENE CITRATE 50 MG PO TABS: 50.0000 mg | ORAL_TABLET | ORAL | 3 refills | Status: DC

## 2021-07-27 NOTE — Telephone Encounter (Signed)
Left a voice mail for the pt to check the portal . I will send a message about normal MRI.    And I will recommend clomiphene for low testosterone     Abby Raelyn Mora, MD  [facility] Endocrinology  [facility] [address] [place], [place] [address] Phone: [phone] FAX: [phone]

## 2021-07-28 ENCOUNTER — Telehealth: Payer: Self-pay

## 2021-07-28 ENCOUNTER — Other Ambulatory Visit (HOSPITAL_COMMUNITY): Payer: Self-pay

## 2021-07-28 NOTE — Telephone Encounter (Signed)
Patient Advocate Encounter   Received notification from [facility] that prior authorization for Clomiphene Citrate 50mg  tabs is required by his/her insurance [employer].  PA submitted on 07/28/21  Key#: [id number]  Status is pending    [facility] will continue to follow:  Patient Advocate Fax:  [phone]

## 2021-07-28 NOTE — Telephone Encounter (Signed)
Patient Advocate Encounter  Prior Authorization for Clomid 50mg  tabs has been approved.    PA#  Effective dates: 07/28/21 through 07/28/22  Per Test Claim Patients co-pay is $5.   Spoke with Pharmacy to Process.  Patient Advocate Fax:  [phone]

## 2021-08-16 ENCOUNTER — Encounter: Payer: Self-pay | Admitting: Family Medicine

## 2021-08-17 ENCOUNTER — Other Ambulatory Visit: Payer: Self-pay

## 2021-08-17 MED ORDER — VITAMIN D (ERGOCALCIFEROL) 1.25 MG (50000 UNIT) PO CAPS: 50000.0000 [IU] | ORAL_CAPSULE | ORAL | 0 refills | Status: DC

## 2021-10-27 ENCOUNTER — Encounter: Payer: Self-pay | Admitting: Internal Medicine

## 2021-10-27 ENCOUNTER — Ambulatory Visit: Payer: BC Managed Care – PPO | Admitting: Internal Medicine

## 2021-10-27 ENCOUNTER — Other Ambulatory Visit: Payer: Self-pay

## 2021-10-27 VITALS — BP 122/82 | HR 70 | Ht 68.5 in | Wt 173.0 lb

## 2021-10-27 DIAGNOSIS — E291 Testicular hypofunction: Secondary | ICD-10-CM | POA: Diagnosis not present

## 2021-10-27 DIAGNOSIS — R7989 Other specified abnormal findings of blood chemistry: Secondary | ICD-10-CM | POA: Diagnosis not present

## 2021-10-27 DIAGNOSIS — E559 Vitamin D deficiency, unspecified: Secondary | ICD-10-CM | POA: Diagnosis not present

## 2021-10-27 LAB — COMPREHENSIVE METABOLIC PANEL
ALT: 25 U/L (ref 0–53)
AST: 28 U/L (ref 0–37)
Albumin: 4.8 g/dL (ref 3.5–5.2)
Alkaline Phosphatase: 29 U/L — ABNORMAL LOW (ref 39–117)
BUN: 14 mg/dL (ref 6–23)
CO2: 30 mEq/L (ref 19–32)
Calcium: 9.7 mg/dL (ref 8.4–10.5)
Chloride: 101 mEq/L (ref 96–112)
Creatinine, Ser: 1.11 mg/dL (ref 0.40–1.50)
GFR: 82.61 mL/min (ref >60.00)
Glucose, Bld: 83 mg/dL (ref 70–99)
Potassium: 4.3 mEq/L (ref 3.5–5.1)
Sodium: 138 mEq/L (ref 135–145)
Total Bilirubin: 0.6 mg/dL (ref 0.2–1.2)
Total Protein: 6.5 g/dL (ref 6.0–8.3)

## 2021-10-27 LAB — CBC
HCT: 44.6 % (ref 39.0–52.0)
Hemoglobin: 15 g/dL (ref 13.0–17.0)
MCHC: 33.7 g/dL (ref 30.0–36.0)
MCV: 88.2 fl (ref 78.0–100.0)
Platelets: 173 10*3/uL (ref 150.0–400.0)
RBC: 5.06 Mil/uL (ref 4.22–5.81)
RDW: 13.4 % (ref 11.5–15.5)
WBC: 8.7 10*3/uL (ref 4.0–10.5)

## 2021-10-27 LAB — TSH: TSH: 1 u[IU]/mL (ref 0.35–5.50)

## 2021-10-27 LAB — T4, FREE: Free T4: 0.89 ng/dL (ref 0.60–1.60)

## 2021-10-27 LAB — VITAMIN D 25 HYDROXY (VIT D DEFICIENCY, FRACTURES): VITD: 34.29 ng/mL (ref 30.00–100.00)

## 2021-10-27 NOTE — Progress Notes (Addendum)
? ? ?Name: Dan [name]  ?MRN/ DOB: [id number], 05/05/1981    ?Age/ Sex: 41 y.o., male   ? ?PCP: Shelva Majestic, MD   ?Reason for Endocrinology Evaluation: Hypogonadism  ?   ?Date of Initial Endocrinology Evaluation: 06/18/2021  ? ? ?HPI: ?Dan [name] is a 42 y.o. male with a past medical history of HTN and dyslipidemia. The patient presented for initial endocrinology clinic visit on 06/18/2021 for consultative assistance with his hypogonadism.  ? ?Patient has been diagnosed with hypogonadism based on 2 low testosterone values of 204.37 NG/DL and 174 NG/dL and 9/44/9675 and 04/30/3845, this has been ordered based on patient request due to complaints of fatigue. ? ? ?On his initial visit to our clinic he was still c/o  fatigue and difficulty lifting weights  ?Denied erectile dysfunction or  decrease libido  ? ? ?Has 2 biological children , has history of low sperm mobility  ? ?He has never had testosterone replacement  ?NO OTC supplements  ?No Marijuana use, uses a CBD product for insomnia  ? ?No narcotic  meds  ? ? ?No fh of prostate cancer or CAD  ? ? ?On  his initial visit to our clinic he had a mildly low testosterone at 218 ng/dL with normal LH 6.59 mIU/mL. Normal TFT;s and Prolactin as well as undetectable HCG  ? ? ?Due to a diagnosis of secondary hypogonadism, we proceeded with pituitary images which were normal.  ? ?Clomiphene started 50 mg three times weekly  ? ? ?SUBJECTIVE:  ? ?Today (10/27/21):  Dan [name] is here for follow-up on hypogonadism. ? ?He has been noted with weight loss ( intentional )  ?He denies any side effects to clomiphene  ?He sleeps well at night  ?Has noted improved energy ? ? ?Has a 73 month old boy and a 48-year-old daughter ? ? ?He completed Ergocalciferol and now on OTC Vitamin D3 2200 daily  ? ?Clomiphene 50 mg three times weekly ( Monday , Wednesday and Friday )   ? ? ?HISTORY:  ?Past Medical History:  ?Past Medical History:  ?Diagnosis Date  ? Blood transfusion  without reported diagnosis   ? HIV and Hep C negative-early 90s, late 21s, pt report. after tonsilectomy  ? Hypertension   ? ?Past Surgical History:  ?Past Surgical History:  ?Procedure Laterality Date  ? TONSILLECTOMY    ? post op bleeding (pt report)  ? WISDOM TOOTH EXTRACTION    ?  ?Social History:  reports that he has never smoked. He has never used smokeless tobacco. He reports current alcohol use. He reports that he does not use drugs. ?Family History: family history includes Diabetes in his paternal grandfather; Hyperlipidemia in his father; Hypertension in his father; Kidney disease in his paternal grandfather; Polycystic kidney disease in his father; Thyroid disease in his mother. ? ? ?HOME MEDICATIONS: ?Allergies as of 10/27/2021   ? ?   Reactions  ? Amoxicillin   ? REACTION: as child ?unspecified  ? ?  ? ?  ?Medication List  ?  ? ?  ? Accurate as of October 27, 2021 10:09 AM. If you have any questions, ask your nurse or doctor.  ?  ?  ? ?  ? ?STOP taking these medications   ? ?CITRACAL PO ?Stopped by: Scarlette Shorts, MD ?  ?Vitamin D (Ergocalciferol) 1.25 MG (50000 UNIT) Caps capsule ?Commonly known as: DRISDOL ?Stopped by: Scarlette Shorts, MD ?  ? ?  ? ?TAKE these medications   ? ?  clomiPHENE 50 MG tablet ?Commonly known as: CLOMID ?Take 1 tablet (50 mg total) by mouth as directed. ?  ?fluticasone 50 MCG/ACT nasal spray ?Commonly known as: FLONASE ?Place 1 spray into both nostrils daily as needed for allergies or rhinitis. ?  ? ?  ?  ? ? ?REVIEW OF SYSTEMS: ?A comprehensive ROS was conducted with the patient and is negative except as per HPI  ? ? ? ?OBJECTIVE:  ?VS: BP 122/82   Pulse 70   Ht 5' 8.5" (1.74 m)   Wt 173 lb (78.5 kg)   SpO2 98%   BMI 25.92 kg/m?   ? ?Wt Readings from Last 3 Encounters:  ?10/27/21 173 lb (78.5 kg)  ?06/18/21 199 lb 6.4 oz (90.4 kg)  ?03/12/21 193 lb (87.5 kg)  ? ? ? ?EXAM: ?General: Pt appears well and is in NAD  ?Lungs: Clear with good BS bilat with no rales,  rhonchi, or wheezes  ?Chest ; No gynecomastia   ?Heart: Auscultation: RRR.  ?Abdomen: Normoactive bowel sounds, soft, nontender, without masses or organomegaly palpable  ?Extremities: No LE edema   ?Mental Status: Judgment, insight: Intact ?Orientation: Oriented to time, place, and person ?Mood and affect: No depression, anxiety, or agitation  ? ? ? ?DATA REVIEWED: ? ? Latest Reference Range & Units 10/27/21 10:20  ?Testosterone, Total, LC-MS-MS 250 - 1,100 ng/dL 201  ? ? Latest Reference Range & Units 10/27/21 10:20  ?Sodium 135 - 145 mEq/L 138  ?Potassium 3.5 - 5.1 mEq/L 4.3  ?Chloride 96 - 112 mEq/L 101  ?CO2 19 - 32 mEq/L 30  ?Glucose 70 - 99 mg/dL 83  ?BUN 6 - 23 mg/dL 14  ?Creatinine 0.40 - 1.50 mg/dL 0.07  ?Calcium 8.4 - 10.5 mg/dL 9.7  ?Alkaline Phosphatase 39 - 117 U/L 29 (L)  ?Albumin 3.5 - 5.2 g/dL 4.8  ?AST 0 - 37 U/L 28  ?ALT 0 - 53 U/L 25  ?Total Protein 6.0 - 8.3 g/dL 6.5  ?Total Bilirubin 0.2 - 1.2 mg/dL 0.6  ?GFR >60.00 mL/min 82.61  ?VITD 30.00 - 100.00 ng/mL 34.29  ? ? ? Latest Reference Range & Units 10/27/21 10:20  ?WBC 4.0 - 10.5 K/uL 8.7  ?RBC 4.22 - 5.81 Mil/uL 5.06  ?Hemoglobin 13.0 - 17.0 g/dL 12.1  ?HCT 39.0 - 52.0 % 44.6  ?MCV 78.0 - 100.0 fl 88.2  ?MCHC 30.0 - 36.0 g/dL 97.5  ?RDW 11.5 - 15.5 % 13.4  ?Platelets 150.0 - 400.0 K/uL 173.0  ?Glucose 70 - 99 mg/dL 83  ? ? Latest Reference Range & Units 10/27/21 10:20  ?TSH 0.35 - 5.50 uIU/mL 1.00  ?T4,Free(Direct) 0.60 - 1.60 ng/dL 8.83  ? ?Testosterone-pending ? ?  ?Results for Dan [name], Dan [name]" (MRN [id number]) as of 06/18/2021 14:03 ? Ref. Range 03/05/2021 08:40 03/26/2021 08:29  ?Testosterone Latest Ref Range: 250 - 827 ng/dL 583.09 (L) 407 (L)  ? ? ? ?MRI brain 07/24/2021 ?IMPRESSION: ?1. Normal MRI appearance of the brain and pituitary without and with ?contrast. ?2. Mild sinus disease ? ? ?ASSESSMENT/PLAN/RECOMMENDATIONS:  ? ?Secondary Hypogonadism: ? ? ?-He has clinically responded well to clomiphene without side  effects ?-Testosterone levels are pending today ?-CBC , CMP  are normal  ?-Testosterone has normalized but it is at the low end of normal, will increase clomiphene as below ? ? ? ?Medication ?Increase clomiphene 50 mg , 4 times weekly ? ? ? ?2.  Vitamin D deficiency: ? ? ?-He has completed ergocalciferol and started OTC vitamin D3 2200  IUs daily ?-He is debating starting an MVI as well which I have encouraged him to do ?-His vitamin D3 is 34.29 NG/mL , and there is room for him to add the multivitamin ? ? ?Follow-up in 6 months ? ? ?Signed electronically by: ?Abby Raelyn MoraJaralla Deletha Jaffee, MD ? ?[facility]  ?[facility] ?[address] ?[place], [place] [address] ?Phone: [phone] ?FAX: [phone] ? ? ?CC: ?Shelva MajesticHunter, Stephen O, MD ?[address] ?[place] [address] ?Phone: [phone] ?Fax: [phone] ? ? ?Return to Endocrinology clinic as below: ?Future Appointments  ?Date Time Provider Department Center  ?03/18/2022  8:00 AM Hunter, Aldine ContesStephen O, MD [facility]  ?  ? ? ? ? ? ?

## 2021-10-28 ENCOUNTER — Encounter: Payer: Self-pay | Admitting: Internal Medicine

## 2021-10-30 LAB — TESTOSTERONE, TOTAL, LC/MS/MS: Testosterone, Total, LC-MS-MS: 297 ng/dL (ref 250–1100)

## 2021-11-01 MED ORDER — CLOMIPHENE CITRATE 50 MG PO TABS: 50.0000 mg | ORAL_TABLET | ORAL | 3 refills | Status: DC

## 2021-11-01 NOTE — Addendum Note (Signed)
Addended by: Scarlette Shorts on: 11/01/2021 03:34 PM ? ? Modules accepted: Orders ? ?

## 2021-12-01 ENCOUNTER — Encounter: Payer: Self-pay | Admitting: Internal Medicine

## 2021-12-01 MED ORDER — CLOMID 50 MG PO TABS: 50.0000 mg | ORAL_TABLET | ORAL | 2 refills | Status: DC

## 2022-03-17 NOTE — Progress Notes (Signed)
Phone: [phone]    Subjective:  Patient presents today for their annual physical. Chief complaint-noted.   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 4 times weekly 52 tablet 2   fluticasone (FLONASE) 50 MCG/ACT nasal spray Place 1 spray into both nostrils daily as needed for allergies or rhinitis.     No current facility-administered medications for this visit.    Allergies-reviewed and updated Allergies  Allergen Reactions   Amoxicillin     REACTION: as child unspecified    Social History   Social History Narrative   Married (wife patient with Dr. Durene Cal), Daughter charlotte born 2019, son born November 2022. 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       Hobbies: golfing, boating, travel      Objective:  BP 120/80   Pulse 76   Temp 98 F (36.7 C)   Ht 5\' 8"  (1.727 m)   Wt 191 lb 9.6 oz (86.9 kg)   SpO2 98%   BMI 29.13 kg/m  Gen: NAD, resting comfortably HEENT: Mucous membranes are moist. Oropharynx normal Neck: no thyromegaly CV: RRR no murmurs rubs or gallops Lungs: CTAB no crackles, wheeze, rhonchi Abdomen: soft/nontender/nondistended/normal bowel sounds. No rebound or guarding.  Ext: no edema Skin: warm, dry Neuro: grossly normal, moves all extremities, PERRLA     Assessment and Plan:  41 y.o. male presenting for annual physical.  Health Maintenance counseling: 1. Anticipatory guidance: Patient counseled regarding regular dental exams -q6 months, eye exams - yearly with contacts,  avoiding smoking and second hand smoke , limiting alcohol to 2 beverages per day, no illicit drugs.   2. Risk factor reduction:  Advised patient of need for regular exercise and diet rich and fruits and vegetables to reduce risk of heart attack and stroke.  Exercise- continues with [employer]Fit daily other than Sunday rest day usually Diet/weight management-peak of 217 in the past and as low as 169-last physical at 193- down 2 lbs from last year- reasonably healthy diet- wants to be in 185-190 range Wt Readings from Last 3  Encounters:  03/18/22 191 lb 9.6 oz (86.9 kg)  10/27/21 173 lb (78.5 kg)  06/18/21 199 lb 6.4 oz (90.4 kg)  3. Immunizations/screenings/ancillary studies-discussed considering flu and COVID shot October or later  Immunization History  Administered Date(s) Administered   Influenza Split 05/15/2012   Influenza, Seasonal, Injecte, Preservative Fre 06/03/2014   Influenza,inj,Quad PF,6+ Mos 05/13/2018, 05/26/2019   Influenza-Unspecified 06/26/2017   Moderna Sars-Covid-2 Vaccination 10/18/2019, 11/08/2019, 06/05/2020   Tdap 10/11/2010,  03/12/2021  4. Prostate cancer screening- on Clomid discussed option of checking PSA- will hold off until 44 most likely unless Dr. Lonzo Cloud recommends otherwise Lab Results  Component Value Date   PSA 0.77 06/25/2021   5. Colon cancer screening -  no family history, start at age 56  6. Skin cancer screening/prevention-dermatology has removed moles in the past for atypia. advised regular sunscreen use. Denies worrisome, changing, or new skin lesions.  7. Testicular cancer screening- advised monthly self exams - advised monthly self exams 8. STD screening- patient opts out-monogamous with prior false positive HIV -he is considering vasectomy- wants to discuss with wife- will reach out if he needs referral 9. Smoking associated screening-never smoker  Status of chronic or acute concerns   #Social update-son born November 2022-he is doing well with his big sister-prior fertility issues-working as a Interior and spatial designer at [employer] starting in fall.   #Family history of diabetes-A1c has been well controlled-we will plan to not repeat unless sugar elevated again  #hypertension S: medication: None after significant weight loss Home readings #s: not checking recenetly BP Readings from Last 3 Encounters:  03/18/22 120/80  10/27/21 122/82  06/18/21 (!) 144/90  A/P: blood pressure doing very well- continue to monitor   #hyperlipidemia S: Medication: None The 10-year ASCVD risk score (Arnett DK, et al., 2019) is: 1.4%  Lab Results  Component Value Date   CHOL 207 (H) 03/05/2021   HDL 45.30 03/05/2021   LDLCALC 106 (H) 10/31/2018   LDLDIRECT 128.0 03/05/2021   TRIG 271.0 (H) 03/05/2021   CHOLHDL 5 03/05/2021   A/P: Minimal elevations on ASCVD risk-if increases above 7.5% consider further work-up- for now continue without meds  #Vitamin D deficiency S: Medication: 2000 units/day last year and reports today slightly more than that- MV plus 2000 units Last vitamin D Lab Results  Component Value  Date   VD25OH 34.29 10/27/2021  A/P: hopefully stable- update vitamin D today. Continue current meds for now   #Low testosterone-working with Dr. Lonzo Cloud S:Last year reported fatigue particularly with working out.  Was not making as much progress with muscle growth and even noted weights he was able to lift decreasing -Treated with Clomid instead of testosterone to avoid lowering sperm count -Dr. Lonzo Cloud thought low vitamin D could be contributing to his symptoms A/P: has been doing well- we will update testosterone and forward to Dr. Lonzo Cloud   Recommended follow up: Return in about 1 year (around 03/19/2023) for physical or sooner if needed.Schedule b4 you leave. Future Appointments  Date Time Provider Department Center  05/03/2022  7:30 AM Shamleffer, Konrad Dolores, MD [facility] None   Lab/Order associations: not fasting- some milk in coffee   ICD-10-CM   1. Preventative health care  Z00.00 Vitamin D (25 hydroxy)    CBC with Differential/Platelet    Comprehensive metabolic panel    Lipid panel    Testosterone, Total, LC/MS/MS    HgB A1c    2. Vitamin D deficiency  E55.9 Vitamin D (25 hydroxy)    3. Primary  hypertension  I10 CBC with Differential/Platelet    Comprehensive metabolic panel    Lipid panel    4. Secondary male hypogonadism  E29.1 Testosterone, Total, LC/MS/MS    5. Family history of diabetes mellitus  Z83.3 HgB A1c     No orders of the defined types were placed in this encounter.  Return precautions advised.  Tana Conch, MD

## 2022-03-18 ENCOUNTER — Encounter: Payer: Self-pay | Admitting: Family Medicine

## 2022-03-18 ENCOUNTER — Ambulatory Visit (INDEPENDENT_AMBULATORY_CARE_PROVIDER_SITE_OTHER): Payer: Commercial Managed Care - PPO | Admitting: Family Medicine

## 2022-03-18 ENCOUNTER — Other Ambulatory Visit: Payer: Self-pay | Admitting: Family Medicine

## 2022-03-18 VITALS — BP 120/80 | HR 76 | Temp 98.0°F | Ht 68.0 in | Wt 191.6 lb

## 2022-03-18 DIAGNOSIS — E291 Testicular hypofunction: Secondary | ICD-10-CM

## 2022-03-18 DIAGNOSIS — I10 Essential (primary) hypertension: Secondary | ICD-10-CM

## 2022-03-18 DIAGNOSIS — Z0000 Encounter for general adult medical examination without abnormal findings: Secondary | ICD-10-CM

## 2022-03-18 DIAGNOSIS — E559 Vitamin D deficiency, unspecified: Secondary | ICD-10-CM

## 2022-03-18 DIAGNOSIS — Z833 Family history of diabetes mellitus: Secondary | ICD-10-CM

## 2022-03-18 LAB — COMPREHENSIVE METABOLIC PANEL
ALT: 28 U/L (ref 0–53)
AST: 24 U/L (ref 0–37)
Albumin: 4.5 g/dL (ref 3.5–5.2)
Alkaline Phosphatase: 27 U/L — ABNORMAL LOW (ref 39–117)
BUN: 13 mg/dL (ref 6–23)
CO2: 29 mEq/L (ref 19–32)
Calcium: 9.3 mg/dL (ref 8.4–10.5)
Chloride: 103 mEq/L (ref 96–112)
Creatinine, Ser: 1.17 mg/dL (ref 0.40–1.50)
GFR: 77.34 mL/min (ref >60.00)
Glucose, Bld: 93 mg/dL (ref 70–99)
Potassium: 4.3 mEq/L (ref 3.5–5.1)
Sodium: 140 mEq/L (ref 135–145)
Total Bilirubin: 0.6 mg/dL (ref 0.2–1.2)
Total Protein: 6.5 g/dL (ref 6.0–8.3)

## 2022-03-18 LAB — LIPID PANEL
Cholesterol: 196 mg/dL (ref 0–200)
HDL: 45.6 mg/dL (ref >39.00)
NonHDL: 150.15
Total CHOL/HDL Ratio: 4
Triglycerides: 202 mg/dL — ABNORMAL HIGH (ref 0.0–149.0)
VLDL: 40.4 mg/dL — ABNORMAL HIGH (ref 0.0–40.0)

## 2022-03-18 LAB — CBC WITH DIFFERENTIAL/PLATELET
Basophils Absolute: 0 10*3/uL (ref 0.0–0.1)
Basophils Relative: 0.5 % (ref 0.0–3.0)
Eosinophils Absolute: 0.1 10*3/uL (ref 0.0–0.7)
Eosinophils Relative: 1.3 % (ref 0.0–5.0)
HCT: 44.9 % (ref 39.0–52.0)
Hemoglobin: 15.1 g/dL (ref 13.0–17.0)
Lymphocytes Relative: 36.4 % (ref 12.0–46.0)
Lymphs Abs: 2 10*3/uL (ref 0.7–4.0)
MCHC: 33.7 g/dL (ref 30.0–36.0)
MCV: 88 fl (ref 78.0–100.0)
Monocytes Absolute: 0.6 10*3/uL (ref 0.1–1.0)
Monocytes Relative: 10.9 % (ref 3.0–12.0)
Neutro Abs: 2.9 10*3/uL (ref 1.4–7.7)
Neutrophils Relative %: 50.9 % (ref 43.0–77.0)
Platelets: 121 10*3/uL — ABNORMAL LOW (ref 150.0–400.0)
RBC: 5.1 Mil/uL (ref 4.22–5.81)
RDW: 13.3 % (ref 11.5–15.5)
WBC: 5.6 10*3/uL (ref 4.0–10.5)

## 2022-03-18 LAB — LDL CHOLESTEROL, DIRECT: Direct LDL: 121 mg/dL

## 2022-03-18 LAB — HEMOGLOBIN A1C: Hgb A1c MFr Bld: 6 % (ref 4.6–6.5)

## 2022-03-18 LAB — VITAMIN D 25 HYDROXY (VIT D DEFICIENCY, FRACTURES): VITD: 28.47 ng/mL — ABNORMAL LOW (ref 30.00–100.00)

## 2022-03-18 MED ORDER — VITAMIN D (ERGOCALCIFEROL) 1.25 MG (50000 UNIT) PO CAPS: 50000.0000 [IU] | ORAL_CAPSULE | ORAL | 0 refills | Status: DC

## 2022-03-18 NOTE — Patient Instructions (Addendum)
Health Maintenance Due  Topic Date Due   COVID-19 Vaccine (4 - Booster for Moderna series) 07/31/2020   INFLUENZA VACCINE  03/15/2022  Consider these after October  Please stop by lab before you go If you have mychart- we will send your results within 3 business days of [place] receiving them.  If you do not have mychart- we will call you about results within 5 business days of [place] receiving them.  *please also note that you will see labs on mychart as soon as they post. I will later go in and write notes on them- will say "notes from Dr. Durene Cal"   Recommended follow up: Return in about 1 year (around 03/19/2023) for physical or sooner if needed.Schedule b4 you leave.

## 2022-03-22 LAB — TESTOSTERONE, TOTAL, LC/MS/MS: Testosterone, Total, LC-MS-MS: 531 ng/dL (ref 250–1100)

## 2022-03-24 ENCOUNTER — Encounter: Payer: Self-pay | Admitting: Internal Medicine

## 2022-03-29 ENCOUNTER — Other Ambulatory Visit (HOSPITAL_COMMUNITY): Payer: Self-pay

## 2022-03-29 ENCOUNTER — Telehealth: Payer: Self-pay | Admitting: Pharmacy Technician

## 2022-03-29 NOTE — Telephone Encounter (Signed)
Patient Advocate Encounter   Received notification from CMA/Office that prior authorization for Clomid 50mg  is required/requested    PA submitted on 03/29/22 to [facility] via over the phone - [phone], faxed office notes to [phone] PA ref #  [id number] Status is pending  Pharmacy Patient Advocate Fax:  [phone]

## 2022-03-31 ENCOUNTER — Other Ambulatory Visit (HOSPITAL_COMMUNITY): Payer: Self-pay

## 2022-03-31 NOTE — Telephone Encounter (Signed)
Patient Advocate Encounter  Prior authorization for Clomid 50mg  has been denied because the medication isn't covered by the plan.     Requested new PA for generic (Clomiphene 50mg )  New PA ref # is - faxed office notes over with new ref #.

## 2022-03-31 NOTE — Telephone Encounter (Signed)
Patient Advocate Encounter   Got pt's corrected Ins information and was able to submit PA request through [facility]. Gave added info from MD abt why the pt should take this medication over another.   PA submitted on 03/31/22 to [facility] [facility] via CoverMyMeds Key [id number] Status is pending  Pharmacy Patient Advocate Fax:  [phone]

## 2022-04-05 NOTE — Telephone Encounter (Signed)
Patient Advocate Encounter  Received notification from [employer] that the request for prior authorization for Clomiphene has been denied due to not being covered by his plan.     This determination is currently being reviewed for appeal.

## 2022-04-05 NOTE — Telephone Encounter (Addendum)
Patient states that the PA was denied and he would like a appeal to be done

## 2022-04-07 NOTE — Telephone Encounter (Signed)
Patient Advocate Encounter  Received notification that the appeal request for Clomiphene has also been denied. The medication just isn't covered by his plan. They sent another denial letter, but the last page is saying the denial is upheld. It's saved in Media if you need to see it.

## 2022-04-12 ENCOUNTER — Encounter: Payer: Self-pay | Admitting: Internal Medicine

## 2022-04-12 NOTE — Telephone Encounter (Signed)
A portal message has been sent to the patient whether he would like to pay for Clomid cash, if the price is doable versus switching to topical testosterone   Awaiting for response    Abby Raelyn Mora, MD  [facility] Endocrinology  [facility] [address] [place], [place] [address] Phone: [phone] FAX: [phone]

## 2022-05-03 ENCOUNTER — Encounter: Payer: Self-pay | Admitting: Internal Medicine

## 2022-05-03 ENCOUNTER — Ambulatory Visit (INDEPENDENT_AMBULATORY_CARE_PROVIDER_SITE_OTHER): Payer: Commercial Managed Care - PPO | Admitting: Internal Medicine

## 2022-05-03 ENCOUNTER — Other Ambulatory Visit: Payer: Self-pay | Admitting: Internal Medicine

## 2022-05-03 VITALS — BP 126/84 | HR 61 | Ht 68.0 in | Wt 192.2 lb

## 2022-05-03 DIAGNOSIS — E291 Testicular hypofunction: Secondary | ICD-10-CM | POA: Diagnosis not present

## 2022-05-03 MED ORDER — CLOMID 50 MG PO TABS: 50.0000 mg | ORAL_TABLET | ORAL | 3 refills | Status: DC

## 2022-05-03 NOTE — Progress Notes (Signed)
Name: Dan [name]  MRN/ DOB: [id number], [birth year]    Age/ Sex: 41 y.o., male    PCP: Marin Olp, MD   Reason for Endocrinology Evaluation: Hypogonadism     Date of Initial Endocrinology Evaluation: 06/18/2021    HPI: Dan [name] is a 41 y.o. male with a past medical history of HTN and dyslipidemia. The patient presented for initial endocrinology clinic visit on 06/18/2021 for consultative assistance with his hypogonadism.   Patient has been diagnosed with hypogonadism based on 2 low testosterone values of 204.37 NG/DL and 163 NG/dL and 03/03/2021 and 04/03/2021, this has been ordered based on patient request due to complaints of fatigue.   On his initial visit to our clinic he was still c/o  fatigue and difficulty lifting weights  Denied erectile dysfunction or  decrease libido    Has 2 biological children , has history of low sperm mobility   He has never had testosterone replacement  NO OTC supplements  No Marijuana use, uses a CBD product for insomnia   No narcotic  meds    No fh of prostate cancer or CAD    On  his initial visit to our clinic he had a mildly low testosterone at 218 ng/dL with normal LH 1.56 mIU/mL. Normal TFT;s and Prolactin as well as undetectable HCG    Due to a diagnosis of secondary hypogonadism, we proceeded with pituitary images which were normal.   Clomiphene    SUBJECTIVE:   Today (05/03/22):  Dan [name] is here for follow-up on hypogonadism.  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 after tonsilectomy   Hypertension    Past Surgical History:  Past Surgical History:  Procedure  Laterality Date   TONSILLECTOMY     post op bleeding (pt report)   WISDOM TOOTH EXTRACTION      Social History:  reports that he has never smoked. He has never used smokeless tobacco. He reports current alcohol use. He reports that he does not use drugs. Family History: family history includes Diabetes in his paternal grandfather; Hyperlipidemia in his father; Hypertension in his father; Kidney disease in his paternal grandfather; Polycystic kidney disease in his father; Thyroid disease in his mother.   HOME MEDICATIONS: Allergies as of 05/03/2022       Reactions   Amoxicillin    REACTION: as child unspecified        Medication List        Accurate as of May 03, 2022 12:11 PM. If you have any questions, ask your nurse or doctor.          Clomid 50 MG tablet Generic drug: clomiPHENE Take 1 tablet (50 mg total) by mouth as directed. 4 times weekly   fluticasone 50 MCG/ACT nasal spray Commonly known as: FLONASE Place 1 spray into both nostrils daily as needed for allergies or rhinitis.   Vitamin D (Ergocalciferol) 1.25 MG (50000 UNIT) Caps capsule Commonly known as: DRISDOL Take 1 capsule (50,000 Units total) by mouth every 7 (seven) days.          REVIEW  OF SYSTEMS: A comprehensive ROS was conducted with the patient and is negative except as per HPI     OBJECTIVE:  VS: BP 126/84 (BP Location: Left Arm, Patient Position: Sitting, Cuff Size: Small)   Pulse 61   Ht 5\' 8"  (1.727 m)   Wt 192 lb 3.2 oz (87.2 kg)   SpO2 98%   BMI 29.22 kg/m    Wt Readings from Last 3 Encounters:  05/03/22 192 lb 3.2 oz (87.2 kg)  03/18/22 191 lb 9.6 oz (86.9 kg)  10/27/21 173 lb (78.5 kg)     EXAM: General: Pt appears well and is in NAD  Lungs: Clear with good BS bilat with no rales, rhonchi, or wheezes  Heart: Auscultation: RRR.  Abdomen: Normoactive bowel sounds, soft, nontender, without masses or organomegaly palpable  Extremities: No LE edema   Mental Status:  Judgment, insight: Intact Orientation: Oriented to time, place, and person Mood and affect: No depression, anxiety, or agitation     DATA REVIEWED:   Latest Reference Range & Units 03/18/22 08:21  Sodium 135 - 145 mEq/L 140  Potassium 3.5 - 5.1 mEq/L 4.3  Chloride 96 - 112 mEq/L 103  CO2 19 - 32 mEq/L 29  Glucose 70 - 99 mg/dL 93  BUN 6 - 23 mg/dL 13  Creatinine 05/18/22 - 2.42 mg/dL 3.53  Calcium 8.4 - 6.14 mg/dL 9.3  Alkaline Phosphatase 39 - 117 U/L 27 (L)  Albumin 3.5 - 5.2 g/dL 4.5  AST 0 - 37 U/L 24  ALT 0 - 53 U/L 28  Total Protein 6.0 - 8.3 g/dL 6.5  Total Bilirubin 0.2 - 1.2 mg/dL 0.6  GFR 43.1 mL/min 77.34    Latest Reference Range & Units 03/18/22 08:21  Testosterone, Total, LC-MS-MS 250 - 1,100 ng/dL 05/18/22       Results for [name], [name]" (MRN Brandt Loosen) as of 06/18/2021 14:03  Ref. Range 03/05/2021 08:40 03/26/2021 08:29  Testosterone Latest Ref Range: 250 - 827 ng/dL 05/26/2021 (L) 712.45 (L)     MRI brain 07/24/2021 IMPRESSION: 1. Normal MRI appearance of the brain and pituitary without and with contrast. 2. Mild sinus disease   ASSESSMENT/PLAN/RECOMMENDATIONS:   Secondary Hypogonadism:   -He has clinically responded well to clomiphene without side effects -Testosterone levels are normal  -CBC , CMP  are normal  - Insurance is not covering clomiphene, we discussed risk and benefits of clomiphene vs testosterone replacement therapy  -Patient would prefer to remain on clomiphene at this time, with the understanding that this is not covered by his insurance as it is an off label use    Medication Continue clomiphene 50 mg , 4 times weekly       Follow-up in 1 year   Signed electronically by: 14/05/2021, MD  [facility] Endocrinology  [facility] [address] [place] [place], [place] Phone: [phone] FAX: [phone]   CC: [phone], MD [address] [place]  [place] Phone: [phone] Fax: [phone]   Return to Endocrinology clinic as below: Future Appointments  Date Time Provider Department Center  03/21/2023  8:00 AM 05/21/2023, MD [facility]  05/04/2023  7:30 AM Audreyanna Butkiewicz, 05/06/2023, MD [facility] None

## 2022-05-09 ENCOUNTER — Encounter: Payer: Self-pay | Admitting: *Deleted

## 2022-05-17 ENCOUNTER — Encounter: Payer: Self-pay | Admitting: Family Medicine

## 2022-05-17 ENCOUNTER — Other Ambulatory Visit: Payer: Self-pay

## 2022-05-17 DIAGNOSIS — Z302 Encounter for sterilization: Secondary | ICD-10-CM

## 2022-06-10 ENCOUNTER — Telehealth (HOSPITAL_COMMUNITY): Payer: Self-pay | Admitting: Orthopedic Surgery

## 2022-06-10 MED ORDER — METHOCARBAMOL 500 MG PO TABS: 500.0000 mg | ORAL_TABLET | Freq: Three times a day (TID) | ORAL | 0 refills | Status: AC | PRN

## 2022-06-10 NOTE — Telephone Encounter (Signed)
Robaxin sent for back spasm

## 2022-07-28 ENCOUNTER — Encounter: Payer: Self-pay | Admitting: *Deleted

## 2022-08-22 ENCOUNTER — Ambulatory Visit: Payer: Commercial Managed Care - PPO | Admitting: Internal Medicine

## 2022-11-24 ENCOUNTER — Telehealth: Payer: Commercial Managed Care - PPO | Admitting: Physician Assistant

## 2022-11-24 DIAGNOSIS — H66001 Acute suppurative otitis media without spontaneous rupture of ear drum, right ear: Secondary | ICD-10-CM

## 2022-11-24 DIAGNOSIS — J019 Acute sinusitis, unspecified: Secondary | ICD-10-CM

## 2022-11-24 DIAGNOSIS — B9689 Other specified bacterial agents as the cause of diseases classified elsewhere: Secondary | ICD-10-CM | POA: Diagnosis not present

## 2022-11-24 MED ORDER — DOXYCYCLINE HYCLATE 100 MG PO TABS: 100.0000 mg | ORAL_TABLET | Freq: Two times a day (BID) | ORAL | 0 refills | Status: DC

## 2022-11-24 MED ORDER — NEOMYCIN-POLYMYXIN-HC 3.5-10000-1 OT SOLN: 3.0000 [drp] | Freq: Four times a day (QID) | OTIC | 0 refills | Status: DC

## 2022-11-24 NOTE — Progress Notes (Signed)
Virtual Visit Consent   Dan [name], you are scheduled for a virtual visit with a [facility] provider today. Just as with appointments in the office, your consent must be obtained to participate. Your consent will be active for this visit and any virtual visit you may have with one of our providers in the next 365 days. If you have a MyChart account, a copy of this consent can be sent to you electronically.  As this is a virtual visit, video technology does not allow for your provider to perform a traditional examination. This may limit your provider's ability to fully assess your condition. If your provider identifies any concerns that need to be evaluated in person or the need to arrange testing (such as labs, EKG, etc.), we will make arrangements to do so. Although advances in technology are sophisticated, we cannot ensure that it will always work on either your end or our end. If the connection with a video visit is poor, the visit may have to be switched to a telephone visit. With either a video or telephone visit, we are not always able to ensure that we have a secure connection.  By engaging in this virtual visit, you consent to the provision of healthcare and authorize for your insurance to be billed (if applicable) for the services provided during this visit. Depending on your insurance coverage, you may receive a charge related to this service.  I need to obtain your verbal consent now. Are you willing to proceed with your visit today? Dan [name] has provided verbal consent on 11/24/2022 for a virtual visit (video or telephone). Margaretann Loveless, PA-C  Date: 11/24/2022 6:49 PM  Virtual Visit via Video Note   I, Margaretann Loveless, connected with  Dan [name]  ([id number], 09-Dec-1980) on 11/24/22 at  6:45 PM EDT by a video-enabled telemedicine application and verified that I am speaking with the correct person using two identifiers.  Location: Patient: Virtual Visit  Location Patient: Home Provider: Virtual Visit Location Provider: Home Office   I discussed the limitations of evaluation and management by telemedicine and the availability of in person appointments. The patient expressed understanding and agreed to proceed.    History of Present Illness: Dan [name] is a 42 y.o. who identifies as a male who was assigned male at birth, and is being seen today for ear pain and congestion.  HPI: Otalgia  There is pain in the right ear. This is a new problem. The current episode started 1 to 4 weeks ago (2 weeks). The problem occurs constantly. The problem has been gradually worsening. There has been no fever. Associated symptoms include coughing (throat clearing cough), headaches, rhinorrhea and a sore throat. Pertinent negatives include no abdominal pain, diarrhea, ear discharge, hearing loss or vomiting. Associated symptoms comments: Nasal congestion. Treatments tried: afrin. The treatment provided no relief.     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 X  7 days, Disp: 10 mL, Rfl: 0   CLOMID 50 MG tablet, Take 1 tablet (50 mg total) by mouth as directed. 4 times weekly, Disp: 52 tablet, Rfl: 3   fluticasone (FLONASE) 50 MCG/ACT nasal spray, Place 1 spray into both nostrils daily as needed for allergies or rhinitis., Disp: , Rfl:    Vitamin D, Ergocalciferol, (DRISDOL) 1.25 MG (50000 UNIT) CAPS capsule,  Take 1 capsule (50,000 Units total) by mouth every 7 (seven) days., Disp: 12 capsule, Rfl: 0  Observations/Objective: Patient is well-developed, well-nourished in no acute distress.  Resting comfortably at home.  Head is normocephalic, atraumatic.  No labored breathing.  Speech is clear and coherent with logical content.  Patient is alert and oriented at baseline.    Assessment and Plan: 1. Non-recurrent acute suppurative otitis media of right ear without spontaneous rupture of tympanic membrane - doxycycline (VIBRA-TABS) 100 MG tablet; Take 1 tablet (100 mg total) by mouth 2 (two) times daily.  Dispense: 20 tablet; Refill: 0 - neomycin-polymyxin-hydrocortisone (CORTISPORIN) OTIC solution; Place 3 drops into the right ear 4 (four) times daily. X 7 days  Dispense: 10 mL; Refill: 0  2. Acute bacterial sinusitis - doxycycline (VIBRA-TABS) 100 MG tablet; Take 1 tablet (100 mg total) by mouth 2 (two) times daily.  Dispense: 20 tablet; Refill: 0  - Worsening symptoms that have not responded to OTC medications.  - Will give Doxycycline and Cortisporin - Continue saline nasal rinses - Could consider to add Flonase (Fluticasone) nasal spray over the counter for possible eustachian tube dysfunction - Steam and humidifier can help - Warm compress to ear - Stay well hydrated and get plenty of rest.  - Seek in person evaluation if no symptom improvement or if symptoms worsen   Follow Up Instructions: I discussed the assessment and treatment plan with the patient. The patient was provided an opportunity to ask questions and all were answered. The patient agreed with the plan and demonstrated an understanding of the instructions.  A copy of instructions were sent to the patient via MyChart unless otherwise noted below.    The patient was advised to call back or seek an in-person evaluation if the symptoms worsen or if the condition fails to improve as anticipated.  Time:  I spent 10 minutes with  the patient via telehealth technology discussing the above problems/concerns.    Margaretann Loveless, PA-C

## 2022-11-24 NOTE — Patient Instructions (Signed)
[name] Bielicki, thank you for joining Margaretann Loveless, PA-C for today's virtual visit.  While this provider is not your primary care provider (PCP), if your PCP is located in our provider database this encounter information will be shared with them immediately following your visit.   A [facility] MyChart account gives you access to today's visit and all your visits, tests, and labs performed at [facility] " click here if you don't have a [facility] MyChart account or go to [email or web address]  Consent: (Patient) Dan [name] provided verbal consent for this virtual visit at the beginning of the encounter.  Current Medications:  Current Outpatient Medications:    doxycycline (VIBRA-TABS) 100 MG tablet, Take 1 tablet (100 mg total) by mouth 2 (two) times daily., Disp: 20 tablet, Rfl: 0   neomycin-polymyxin-hydrocortisone (CORTISPORIN) OTIC solution, Place 3 drops into the right ear 4 (four) times daily. X 7 days, Disp: 10 mL, Rfl: 0   CLOMID 50 MG tablet, Take 1 tablet (50 mg total) by mouth as directed. 4 times weekly, Disp: 52 tablet, Rfl: 3   fluticasone (FLONASE) 50 MCG/ACT nasal spray, Place 1 spray into both nostrils daily as needed for allergies or rhinitis., Disp: , Rfl:    Vitamin D, Ergocalciferol, (DRISDOL) 1.25 MG (50000 UNIT) CAPS capsule, Take 1 capsule (50,000 Units total) by mouth every 7 (seven) days., Disp: 12 capsule, Rfl: 0   Medications ordered in this encounter:  Meds ordered this encounter  Medications   doxycycline (VIBRA-TABS) 100 MG tablet    Sig: Take 1 tablet (100 mg total) by mouth 2 (two) times daily.    Dispense:  20 tablet    Refill:  0    Order Specific Question:   Supervising Provider    Answer:   Merrilee Jansky [id number]   neomycin-polymyxin-hydrocortisone (CORTISPORIN) OTIC solution    Sig: Place 3 drops into the right ear 4 (four) times daily. X 7 days    Dispense:  10 mL    Refill:  0    Order Specific Question:    Supervising Provider    Answer:   Merrilee Jansky [[id number]]     *If you need refills on other medications prior to your next appointment, please contact your pharmacy*  Follow-Up: Call back or seek an in-person evaluation if the symptoms worsen or if the condition fails to improve as anticipated.  [facility] [phone]  Other Instructions  Otitis Media, Adult  Otitis media occurs when there is inflammation and fluid in the middle ear with signs and symptoms of an acute infection. The middle ear is a part of the ear that contains bones for hearing as well as air that helps send sounds to the brain. When infected fluid builds up in this space, it causes pressure and can lead to an ear infection. The eustachian tube connects the middle ear to the back of the nose (nasopharynx) and normally allows air into the middle ear. If the eustachian tube becomes blocked, fluid can build up and become infected. What are the causes? This condition is caused by a blockage in the eustachian tube. This can be caused by mucus or by swelling of the tube. Problems that can cause a blockage include: A cold or other upper respiratory infection. Allergies. An irritant, such as tobacco smoke. Enlarged adenoids. The adenoids are areas of soft tissue located high in the back of the throat, behind the nose and the roof of the mouth. They  are part of the body's defense system (immune system). A mass in the nasopharynx. Damage to the ear caused by pressure changes (barotrauma). What increases the risk? You are more likely to develop this condition if you: Smoke or are exposed to tobacco smoke. Have an opening in the roof of your mouth (cleft palate). Have gastroesophageal reflux. Have an immune system disorder. What are the signs or symptoms? Symptoms of this condition include: Ear pain. Fever. Decreased hearing. Tiredness (lethargy). Fluid leaking from the ear, if the eardrum is ruptured  or has burst. Ringing in the ear. How is this diagnosed?  This condition is diagnosed with a physical exam. During the exam, your health care provider will use an instrument called an otoscope to look in your ear and check for redness, swelling, and fluid. He or she will also ask about your symptoms. Your health care provider may also order tests, such as: A pneumatic otoscopy. This is a test to check the movement of the eardrum. It is done by squeezing a small amount of air into the ear. A tympanogram. This is a test that shows how well the eardrum moves in response to air pressure in the ear canal. It provides a graph for your health care provider to review. How is this treated? This condition can go away on its own within 3-5 days. But if the condition is caused by a bacterial infection and does not go away on its own, or if it keeps coming back, your health care provider may: Prescribe antibiotic medicine to treat the infection. Prescribe or recommend medicines to control pain. Follow these instructions at home: Take over-the-counter and prescription medicines only as told by your health care provider. If you were prescribed an antibiotic medicine, take it as told by your health care provider. Do not stop taking the antibiotic even if you start to feel better. Keep all follow-up visits. This is important. Contact a health care provider if: You have bleeding from your nose. There is a lump on your neck. You are not feeling better in 5 days. You feel worse instead of better. Get help right away if: You have severe pain that is not controlled with medicine. You have swelling, redness, or pain around your ear. You have stiffness in your neck. A part of your face is not moving (paralyzed). The bone behind your ear (mastoid bone) is tender when you touch it. You develop a severe headache. Summary Otitis media is redness, soreness, and swelling of the middle ear, usually resulting in pain  and decreased hearing. This condition can go away on its own within 3-5 days. If the problem does not go away in 3-5 days, your health care provider may give you medicines to treat the infection. If you were prescribed an antibiotic medicine, take it as told by your health care provider. Follow all instructions that were given to you by your health care provider. This information is not intended to replace advice given to you by your health care provider. Make sure you discuss any questions you have with your health care provider. Document Revised: 11/09/2020 Document Reviewed: 11/09/2020 Elsevier Patient Education  2023 Elsevier Inc.    If you have been instructed to have an in-person evaluation today at a local Urgent Care facility, please use the link below. It will take you to a list of all of our available [facility], including address, phone number and hours of operation. Please do not delay care.  [facility]  If you or a family member do not have a primary care provider, use the link below to schedule a visit and establish care. When you choose a [facility] primary care physician or advanced practice provider, you gain a long-term partner in health. Find a Primary Care Provider  Learn more about [facility]'s in-office and virtual care options: [facility] - Get Care Now

## 2023-03-14 ENCOUNTER — Encounter: Payer: Self-pay | Admitting: Family Medicine

## 2023-03-21 ENCOUNTER — Encounter: Payer: Self-pay | Admitting: Family Medicine

## 2023-03-21 ENCOUNTER — Ambulatory Visit (INDEPENDENT_AMBULATORY_CARE_PROVIDER_SITE_OTHER): Payer: Commercial Managed Care - PPO | Admitting: Family Medicine

## 2023-03-21 VITALS — BP 124/84 | HR 67 | Temp 97.2°F | Ht 68.0 in | Wt 195.0 lb

## 2023-03-21 DIAGNOSIS — Z0000 Encounter for general adult medical examination without abnormal findings: Secondary | ICD-10-CM | POA: Diagnosis not present

## 2023-03-21 DIAGNOSIS — Z125 Encounter for screening for malignant neoplasm of prostate: Secondary | ICD-10-CM

## 2023-03-21 DIAGNOSIS — E291 Testicular hypofunction: Secondary | ICD-10-CM | POA: Diagnosis not present

## 2023-03-21 DIAGNOSIS — E559 Vitamin D deficiency, unspecified: Secondary | ICD-10-CM

## 2023-03-21 DIAGNOSIS — Z131 Encounter for screening for diabetes mellitus: Secondary | ICD-10-CM

## 2023-03-21 DIAGNOSIS — E785 Hyperlipidemia, unspecified: Secondary | ICD-10-CM

## 2023-03-21 DIAGNOSIS — I10 Essential (primary) hypertension: Secondary | ICD-10-CM

## 2023-03-21 DIAGNOSIS — E663 Overweight: Secondary | ICD-10-CM

## 2023-03-21 NOTE — Progress Notes (Signed)
Phone: [phone]    Subjective:  Patient presents today for their annual physical. Chief complaint-noted.   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 Sande Brothers   WISDOM TOOTH EXTRACTION      Family History  Problem Relation Age of Onset   Thyroid disease Mother    Hyperlipidemia Father    Hypertension Father    Polycystic kidney disease Father    Diabetes Father    Kidney disease Father    Diabetes Paternal Grandfather        paternal grandmother   Kidney disease Paternal Grandfather     Medications- reviewed and updated Current Outpatient Medications  Medication Sig Dispense Refill   CLOMID 50 MG tablet Take 1 tablet (50 mg total) by mouth as directed. 4 times weekly 52 tablet 3   fluticasone (FLONASE) 50 MCG/ACT nasal spray Place 1 spray into both nostrils daily as needed for allergies or rhinitis.     No current facility-administered medications for this visit.    Allergies-reviewed and updated Allergies  Allergen Reactions   Amoxicillin     REACTION: as child unspecified    Social History   Social History Narrative   Married (wife patient with Dr. Durene Cal), Daughter charlotte born 2019, son born November 2022. 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       Hobbies: golfing, boating, travel      Objective:  BP 124/84   Pulse 67   Temp (!) 97.2 F (36.2 C)   Ht 5\' 8"  (1.727 m)   Wt 195 lb (88.5 kg)   SpO2 95%   BMI 29.65 kg/m  Gen: NAD, resting comfortably HEENT: Mucous membranes are moist. Oropharynx normal Neck: no thyromegaly CV: RRR no murmurs rubs or gallops Lungs: CTAB no crackles, wheeze, rhonchi Abdomen: soft/nontender/nondistended/normal bowel sounds. No rebound or guarding.  Ext: no edema Skin: warm, dry Neuro: grossly normal, moves all extremities, PERRLA    Assessment and Plan:  42 y.o. male presenting for annual physical.  Health Maintenance counseling: 1. Anticipatory guidance: Patient counseled regarding regular dental exams -q6 months, eye exams -yearly with contacts or glasses,  avoiding smoking and second hand smoke , limiting alcohol to 2 beverages per day- 2 a week or less, no illicit drugs.   2. Risk factor reduction:  Advised patient of need for regular exercise and diet rich and fruits and vegetables to reduce risk of heart attack and stroke.  Exercise- regular crossfit last year and about 3-4 right now.  Diet/weight  management-within 4 lbs of last year- peak weight 217 and as low as 169- he prefers 185-190 range so wants to work on this- feels could clean it up. Journaling or myfitnesspal or intermittent fasting (works best) [employer] from Last 3 Encounters:  03/21/23 195 lb (88.5 kg)  05/03/22 192 lb 3.2 oz (87.2 kg)  03/18/22 191 lb 9.6 oz (86.9 kg)  3. Immunizations/screenings/ancillary studies-opts out of flu for now- will get with sister at pharmacy- and COVID 19  Immunization History  Administered Date(s) Administered   Influenza Split 05/15/2012   Influenza, Seasonal,  Injecte, Preservative Fre 06/03/2014   Influenza,inj,Quad PF,6+ Mos 05/13/2018, 05/26/2019   Influenza-Unspecified 06/26/2017   Moderna Sars-Covid-2 Vaccination 10/18/2019, 11/08/2019, 06/05/2020   Tdap 10/11/2010, 03/12/2021  4. Prostate cancer screening-  on clomid- will do PSA starting at 45 regularly- q2 years for now  Lab Results  Component Value Date   PSA 0.77 06/25/2021   5. Colon cancer screening - no family history, start at age 37  6. Skin cancer screening/prevention- has seen dermatology in past and had moles removed for atypia- just had visit 6-8 months ago and got 2 year interval. advised regular sunscreen use. Denies worrisome, changing, or new skin lesions.  7. Testicular cancer screening- advised monthly self exams  8. STD screening- patient opts out- monogamous and prior false positive HIV. Considered vasectomy last year - he has done that in January this year 9. Smoking associated screening- Never smoker   Status of chronic or acute concerns   #Social update- director at lower school at [employer]!   #family history diabetes and a1c elevation last year- check a1c  Lab Results  Component Value Date   HGBA1C 6.0 03/18/2022   #hypertension S: medication: reasonable control after prior weight loss from nearly 220  Home readings #s: no recent checks BP Readings from Last 3 Encounters:  03/21/23 124/84  05/03/22 126/84  03/18/22 120/80  A/P: reasonable control without medications - continue to monitor   #hyperlipidemia S: Medication:none The 10-year ASCVD risk score (Arnett DK, et al., 2019) is: 1.5%   Lab Results  Component Value Date   CHOL 196 03/18/2022   HDL 45.60 03/18/2022   LDLCALC 106 (H) 10/31/2018   LDLDIRECT 121.0 03/18/2022   TRIG 202.0 (H) 03/18/2022   CHOLHDL 4 03/18/2022   A/P: no need for medicine- continue to monitor and update lipids -work on healthy eating and regular exercise   #Vitamin D deficiency S: Medication: multivitamin plus  2000 units vitamin D last year- did short term boost now back on the prior Last vitamin D Lab Results  Component Value Date   VD25OH 28.47 (L) 03/18/2022   A/P: hopefully improved- update vitamin D today. Continue current meds for now    #Low testosterone- works with Dr. Lonzo Cloud- check testosterone   Recommended follow up: Return in about 1 year (around 03/20/2024) for physical or sooner if needed.Schedule b4 you leave. Future Appointments  Date Time Provider Department Center  05/04/2023  7:30 AM Shamleffer, Konrad Dolores, MD [facility] None   Lab/Order associations: fasting   ICD-10-CM   1. Preventative health care  Z00.00     2. Primary hypertension  I10 Comprehensive metabolic panel    CBC with Differential/Platelet    Lipid panel    TSH    3. Vitamin D deficiency  E55.9 VITAMIN D 25 Hydroxy (Vit-D Deficiency, Fractures)    4. Secondary male hypogonadism  E29.1 Testosterone    5. Screening for prostate cancer  Z12.5 PSA    6. Mild hyperlipidemia  E78.5 Comprehensive metabolic panel    CBC with Differential/Platelet    Lipid panel    TSH    7. Screening for diabetes mellitus  Z13.1 Hemoglobin A1c    8. Overweight  E66.3 Hemoglobin A1c     No orders of the defined types were placed in this encounter.  Return precautions advised.   Tana Conch, MD

## 2023-03-21 NOTE — Addendum Note (Signed)
Addended by: Lorn Junes on: 03/21/2023 01:51 PM   Modules accepted: Orders

## 2023-03-21 NOTE — Patient Instructions (Addendum)
Let Dan [name] know if you get your flu or COVID vaccine this fall.  Lets get back on the intermittent fasting since that works well for you. Great job on exercise  Please stop by lab before you go If you have mychart- we will send your results within 3 business days of Dan [name] receiving them.  If you do not have mychart- we will call you about results within 5 business days of Dan [name] receiving them.  *please also note that you will see labs on mychart as soon as they post. I will later go in and write notes on them- will say "notes from Dr. Durene Cal"   Recommended follow up: Return in about 1 year (around 03/20/2024) for physical or sooner if needed.Schedule b4 you leave.

## 2023-03-27 ENCOUNTER — Telehealth: Payer: Self-pay

## 2023-03-27 NOTE — Telephone Encounter (Signed)
  Pt was here for OV on 08/06 and states he had labs done, according to our system and speaking with the lab no labs were collected on this pt. Can you call and schedule lab visit for pt one day this week please?

## 2023-03-27 NOTE — Telephone Encounter (Signed)
Patient is scheduled for labs 03/29/23

## 2023-03-29 ENCOUNTER — Other Ambulatory Visit (INDEPENDENT_AMBULATORY_CARE_PROVIDER_SITE_OTHER): Payer: Commercial Managed Care - PPO

## 2023-03-29 DIAGNOSIS — I10 Essential (primary) hypertension: Secondary | ICD-10-CM

## 2023-03-29 DIAGNOSIS — E785 Hyperlipidemia, unspecified: Secondary | ICD-10-CM

## 2023-03-29 DIAGNOSIS — E663 Overweight: Secondary | ICD-10-CM

## 2023-03-29 DIAGNOSIS — Z131 Encounter for screening for diabetes mellitus: Secondary | ICD-10-CM

## 2023-03-29 DIAGNOSIS — Z125 Encounter for screening for malignant neoplasm of prostate: Secondary | ICD-10-CM

## 2023-03-29 DIAGNOSIS — E291 Testicular hypofunction: Secondary | ICD-10-CM

## 2023-03-29 DIAGNOSIS — E559 Vitamin D deficiency, unspecified: Secondary | ICD-10-CM

## 2023-03-29 LAB — HEMOGLOBIN A1C: Hgb A1c MFr Bld: 5.8 % (ref 4.6–6.5)

## 2023-03-29 LAB — COMPREHENSIVE METABOLIC PANEL
ALT: 24 U/L (ref 0–53)
AST: 19 U/L (ref 0–37)
Albumin: 4.4 g/dL (ref 3.5–5.2)
Alkaline Phosphatase: 24 U/L — ABNORMAL LOW (ref 39–117)
BUN: 13 mg/dL (ref 6–23)
CO2: 32 mEq/L (ref 19–32)
Calcium: 9 mg/dL (ref 8.4–10.5)
Chloride: 102 mEq/L (ref 96–112)
Creatinine, Ser: 1.07 mg/dL (ref 0.40–1.50)
GFR: 85.48 mL/min (ref >60.00)
Glucose, Bld: 95 mg/dL (ref 70–99)
Potassium: 4.3 mEq/L (ref 3.5–5.1)
Sodium: 139 mEq/L (ref 135–145)
Total Bilirubin: 0.6 mg/dL (ref 0.2–1.2)
Total Protein: 6.2 g/dL (ref 6.0–8.3)

## 2023-03-29 LAB — CBC WITH DIFFERENTIAL/PLATELET
Basophils Absolute: 0 10*3/uL (ref 0.0–0.1)
Basophils Relative: 0.3 % (ref 0.0–3.0)
Eosinophils Absolute: 0.1 10*3/uL (ref 0.0–0.7)
Eosinophils Relative: 1.1 % (ref 0.0–5.0)
HCT: 45.5 % (ref 39.0–52.0)
Hemoglobin: 15 g/dL (ref 13.0–17.0)
Lymphocytes Relative: 32.6 % (ref 12.0–46.0)
Lymphs Abs: 2.4 10*3/uL (ref 0.7–4.0)
MCHC: 33 g/dL (ref 30.0–36.0)
MCV: 88.4 fl (ref 78.0–100.0)
Monocytes Absolute: 0.6 10*3/uL (ref 0.1–1.0)
Monocytes Relative: 8.2 % (ref 3.0–12.0)
Neutro Abs: 4.2 10*3/uL (ref 1.4–7.7)
Neutrophils Relative %: 57.8 % (ref 43.0–77.0)
Platelets: 143 10*3/uL — ABNORMAL LOW (ref 150.0–400.0)
RBC: 5.15 Mil/uL (ref 4.22–5.81)
RDW: 13.3 % (ref 11.5–15.5)
WBC: 7.3 10*3/uL (ref 4.0–10.5)

## 2023-03-29 LAB — TESTOSTERONE: Testosterone: 337.08 ng/dL (ref 300.00–890.00)

## 2023-03-29 LAB — LIPID PANEL
Cholesterol: 186 mg/dL (ref 0–200)
HDL: 43.8 mg/dL (ref >39.00)
NonHDL: 142.53
Total CHOL/HDL Ratio: 4
Triglycerides: 203 mg/dL — ABNORMAL HIGH (ref 0.0–149.0)
VLDL: 40.6 mg/dL — ABNORMAL HIGH (ref 0.0–40.0)

## 2023-03-29 LAB — TSH: TSH: 1.82 u[IU]/mL (ref 0.35–5.50)

## 2023-03-29 LAB — LDL CHOLESTEROL, DIRECT: Direct LDL: 135 mg/dL

## 2023-03-29 LAB — VITAMIN D 25 HYDROXY (VIT D DEFICIENCY, FRACTURES): VITD: 35 ng/mL (ref 30.00–100.00)

## 2023-03-29 LAB — PSA: PSA: 0.76 ng/mL (ref 0.10–4.00)

## 2023-03-31 ENCOUNTER — Encounter: Payer: Self-pay | Admitting: Family Medicine

## 2023-05-03 NOTE — Progress Notes (Unsigned)
Name: Dan [name]  MRN/ DOB: [id number], Dec 02, 1980    Age/ Sex: 42 y.o., male    PCP: Shelva Majestic, MD   Reason for Endocrinology Evaluation: Hypogonadism     Date of Initial Endocrinology Evaluation: 06/18/2021    HPI: Mr. KENTARO TOZER is a 42 y.o. male with a past medical history of HTN and dyslipidemia. The patient presented for initial endocrinology clinic visit on 06/18/2021 for consultative assistance with his hypogonadism.   Patient has been diagnosed with hypogonadism based on 2 low testosterone values of 204.37 NG/DL and 478 NG/dL and 2/95/6213 and 0/86/5784, this has been ordered based on patient request due to complaints of fatigue.   On his initial visit to our clinic he was still c/o  fatigue and difficulty lifting weights  Denied erectile dysfunction or  decrease libido    Has 2 biological children , has history of low sperm mobility   He has never had testosterone replacement  NO OTC supplements  No Marijuana use, uses a CBD product for insomnia   No narcotic  meds    No fh of prostate cancer or CAD    On  his initial visit to our clinic he had a mildly low testosterone at 218 ng/dL with normal LH 6.96 mIU/mL. Normal TFT;s and Prolactin as well as undetectable HCG    Due to a diagnosis of secondary hypogonadism, we proceeded with pituitary images which were normal.   Clomiphene    SUBJECTIVE:   Today (05/03/23):  Mr. Norcia is here for follow-up on hypogonadism.  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 after tonsilectomy   Hypertension    Past Surgical History:  Past Surgical History:  Procedure  Laterality Date   TONSILLECTOMY     post op bleeding (pt report)   VASECTOMY     january 2024- [facility] Dr. Sande Brothers   WISDOM TOOTH EXTRACTION      Social History:  reports that he has never smoked. He has never used smokeless tobacco. He reports current alcohol use. He reports that he does not use drugs. Family History: family history includes Diabetes in his father and paternal grandfather; Hyperlipidemia in his father; Hypertension in his father; Kidney disease in his father and paternal grandfather; Polycystic kidney disease in his father; Thyroid disease in his mother.   HOME MEDICATIONS: Allergies as of 05/04/2023       Reactions   Amoxicillin    REACTION: as child unspecified        Medication List        Accurate as of May 03, 2023 12:07 PM. If you have any questions, ask your nurse or doctor.          Clomid 50 MG tablet Generic drug: clomiPHENE Take 1 tablet (50 mg total) by mouth as directed. 4 times weekly   fluticasone 50 MCG/ACT nasal spray Commonly known as: FLONASE Place 1 spray into both nostrils daily as needed for allergies or rhinitis.          REVIEW OF SYSTEMS: A comprehensive ROS was conducted with the patient  and is negative except as per HPI     OBJECTIVE:  VS: There were no vitals taken for this visit.   Wt Readings from Last 3 Encounters:  03/21/23 195 lb (88.5 kg)  05/03/22 192 lb 3.2 oz (87.2 kg)  03/18/22 191 lb 9.6 oz (86.9 kg)     EXAM: General: Pt appears well and is in NAD  Lungs: Clear with good BS bilat with no rales, rhonchi, or wheezes  Heart: Auscultation: RRR.  Abdomen: Normoactive bowel sounds, soft, nontender, without masses or organomegaly palpable  Extremities: No LE edema   Mental Status: Judgment, insight: Intact Orientation: Oriented to time, place, and person Mood and affect: No depression, anxiety, or agitation     DATA REVIEWED:   Latest Reference Range & Units 03/18/22 08:21   Sodium 135 - 145 mEq/L 140  Potassium 3.5 - 5.1 mEq/L 4.3  Chloride 96 - 112 mEq/L 103  CO2 19 - 32 mEq/L 29  Glucose 70 - 99 mg/dL 93  BUN 6 - 23 mg/dL 13  Creatinine 6.64 - 4.03 mg/dL 4.74  Calcium 8.4 - 25.9 mg/dL 9.3  Alkaline Phosphatase 39 - 117 U/L 27 (L)  Albumin 3.5 - 5.2 g/dL 4.5  AST 0 - 37 U/L 24  ALT 0 - 53 U/L 28  Total Protein 6.0 - 8.3 g/dL 6.5  Total Bilirubin 0.2 - 1.2 mg/dL 0.6  GFR >56.38 mL/min 77.34    Latest Reference Range & Units 03/18/22 08:21  Testosterone, Total, LC-MS-MS 250 - 1,100 ng/dL 756       Results for SHAYON, RAMM" (MRN [id number]) as of 06/18/2021 14:03  Ref. Range 03/05/2021 08:40 03/26/2021 08:29  Testosterone Latest Ref Range: 250 - 827 ng/dL 416.60 (L) 630 (L)     MRI brain 07/24/2021 IMPRESSION: 1. Normal MRI appearance of the brain and pituitary without and with contrast. 2. Mild sinus disease   ASSESSMENT/PLAN/RECOMMENDATIONS:   Secondary Hypogonadism:   -He has clinically responded well to clomiphene without side effects -Testosterone levels are normal  -CBC , CMP  are normal  - Insurance is not covering clomiphene, we discussed risk and benefits of clomiphene vs testosterone replacement therapy  -Patient would prefer to remain on clomiphene at this time, with the understanding that this is not covered by his insurance as it is an off label use    Medication Continue clomiphene 50 mg , 4 times weekly       Follow-up in 1 year   Signed electronically by: Lyndle Herrlich, MD  [facility]  [facility] [address] [place] [place], [place] [address] Phone: [phone] FAX: [phone]   CC: Shelva Majestic, MD [address] [place] [address] Phone: [phone] Fax: [phone]   Return to Endocrinology clinic as below: Future Appointments  Date Time Provider Department Center  05/04/2023  7:30 AM Ebonee Stober, Konrad Dolores, MD  [facility] None  03/22/2024  8:00 AM Shelva Majestic, MD [facility]

## 2023-05-04 ENCOUNTER — Ambulatory Visit: Payer: Commercial Managed Care - PPO | Admitting: Internal Medicine

## 2023-05-04 ENCOUNTER — Encounter: Payer: Self-pay | Admitting: Internal Medicine

## 2023-05-04 VITALS — BP 120/72 | HR 66 | Ht 68.0 in | Wt 201.8 lb

## 2023-05-04 DIAGNOSIS — E291 Testicular hypofunction: Secondary | ICD-10-CM

## 2023-05-04 MED ORDER — CLOMID 50 MG PO TABS: 50.0000 mg | ORAL_TABLET | ORAL | 3 refills | Status: DC

## 2023-05-12 ENCOUNTER — Other Ambulatory Visit: Payer: Self-pay | Admitting: Internal Medicine

## 2023-05-15 ENCOUNTER — Other Ambulatory Visit: Payer: Self-pay | Admitting: Internal Medicine

## 2023-10-11 IMAGING — MR MR HEAD WO/W CM
13 of 19 series · 34 of 48 positions shown · IV contrast (9ML MULTIHANCE)
Comparison: MR head without contrast 02/02/2012

CLINICAL DATA: Low testosterone in male. Hypogonadotrophic
hypogonadism. Please evaluate for pituitary tumor.

EXAM:
MRI HEAD WITHOUT AND WITH CONTRAST
TECHNIQUE: Multiplanar, multiecho pulse sequences of the brain and surrounding
structures were obtained without and with intravenous contrast.
CONTRAST:  9mL MULTIHANCE GADOBENATE DIMEGLUMINE 529 MG/ML IV SOLN

[Series 2: T1 · sagittal · 5.0mm · 0.53mm/px · 3 of 25 slices shown (1 of 3)]
[im 1/25]
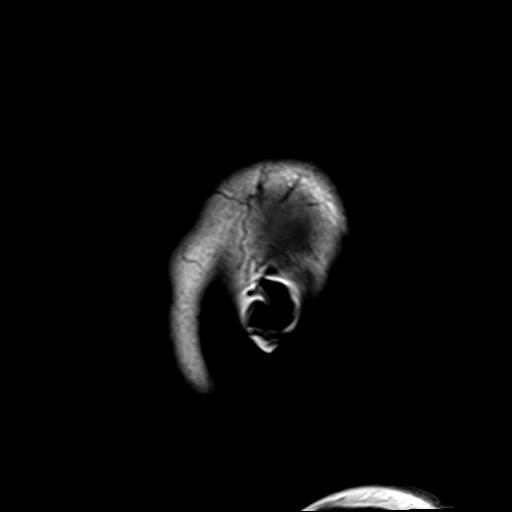
[im 13/25]
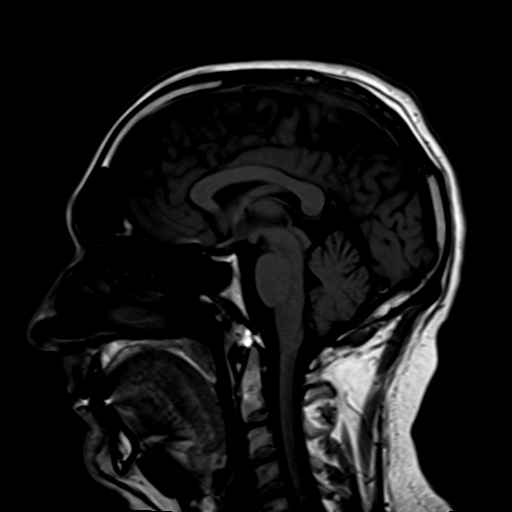
[im 25/25]
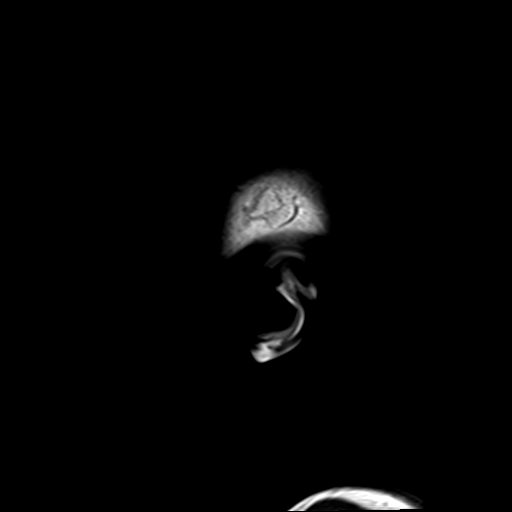

[Series 3: DWI · axial · 3.0mm · 1.95mm/px · z∈[-53,+103]mm · 8 of 106 slices shown]
[im 1/106]
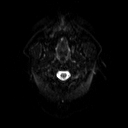
[im 12/106]
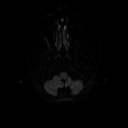
[im 36/106]
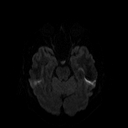
[im 47/106]
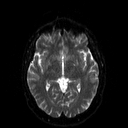
[im 59/106]
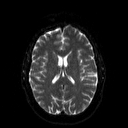
[im 71/106]
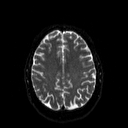
[im 94/106]
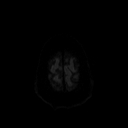
[im 106/106]
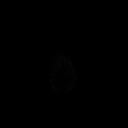

[Series 4: dwi_adc · axial · 3.0mm · 1.95mm/px · z∈[-53,+103]mm · 5 of 52 slices shown]
[im 1/52]
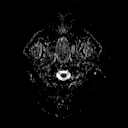
[im 13/52]
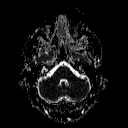
[im 26/52]
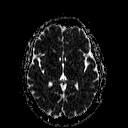
[im 39/52]
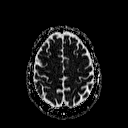
[im 52/52]
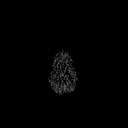

[Series 5: T2 · axial · 5.0mm · 0.39mm/px · z∈[-51,+98]mm · 2 of 24 slices shown (1 of 2)]
[im 1/24]
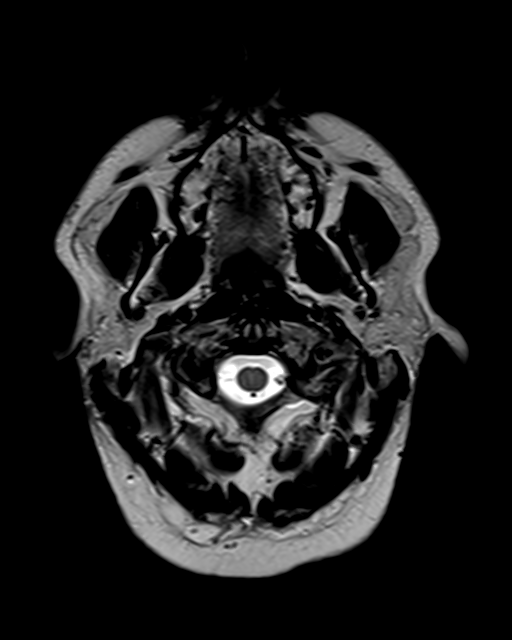
[im 24/24]
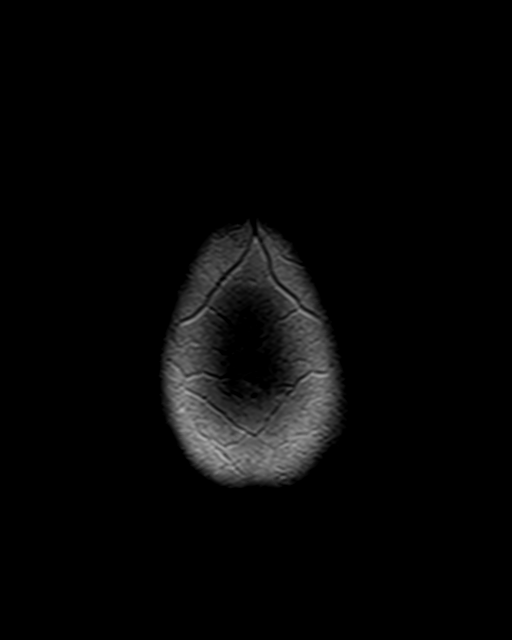

[Series 6: FLAIR · axial · 3.0mm · 0.49mm/px · z∈[-51,+98]mm · 3 of 33 slices shown]
[im 1/33]
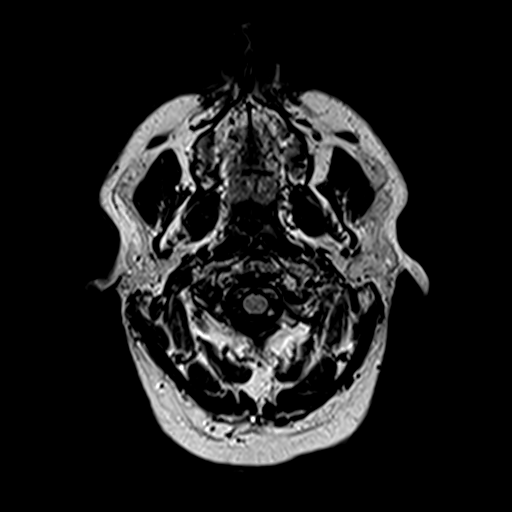
[im 17/33]
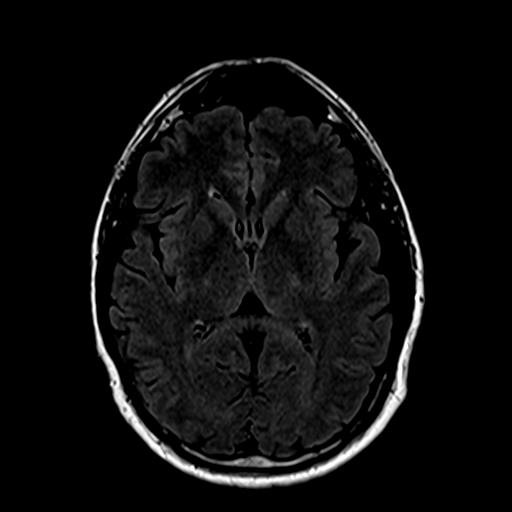
[im 33/33]
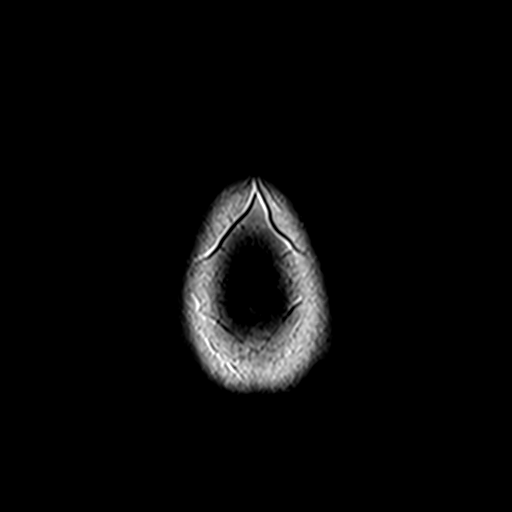

[Series 7: T2 · coronal · 5.0mm · 0.45mm/px · 3 of 30 slices shown (2 of 2)]
[im 1/30]
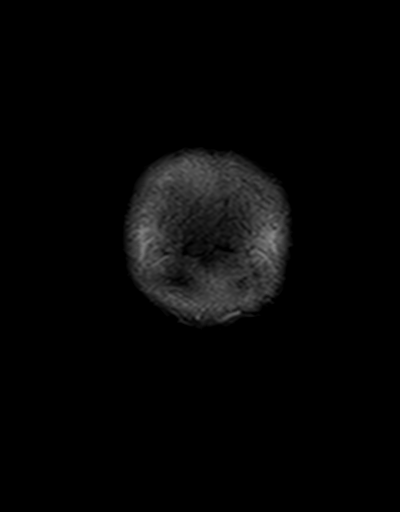
[im 15/30]
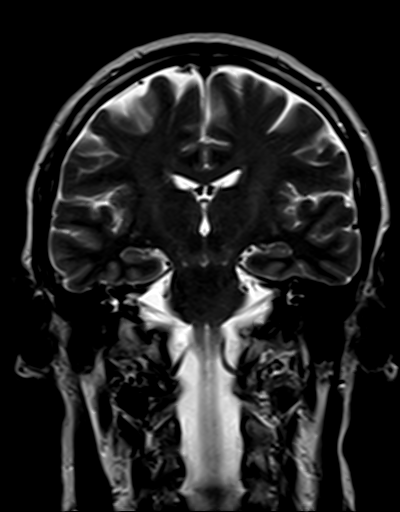
[im 30/30]
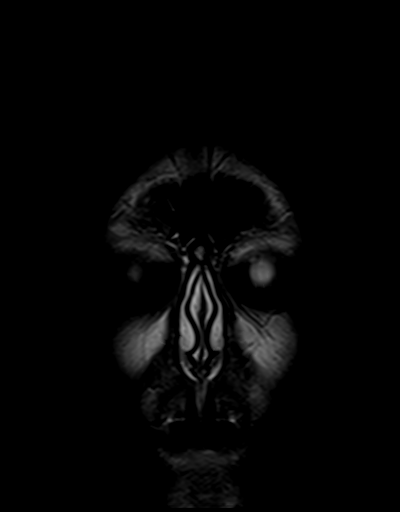

[Series 9: swi_images · axial · 4.0mm · 0.98mm/px · z∈[-55,+101]mm · 4 of 40 slices shown]
[im 1/40]
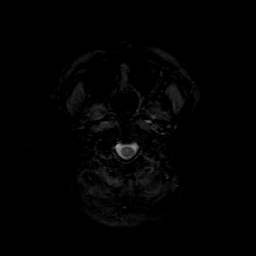
[im 14/40]
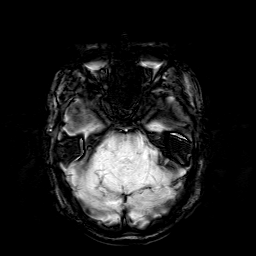
[im 27/40]
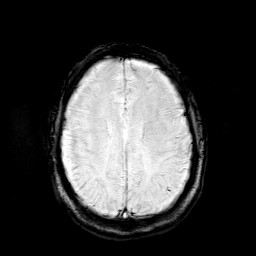
[im 40/40]
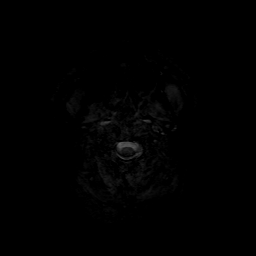

[Series 10: T1 · sagittal · 3.0mm · 0.33mm/px · 1 of 13 slices shown (2 of 3)]
[im 1/13]
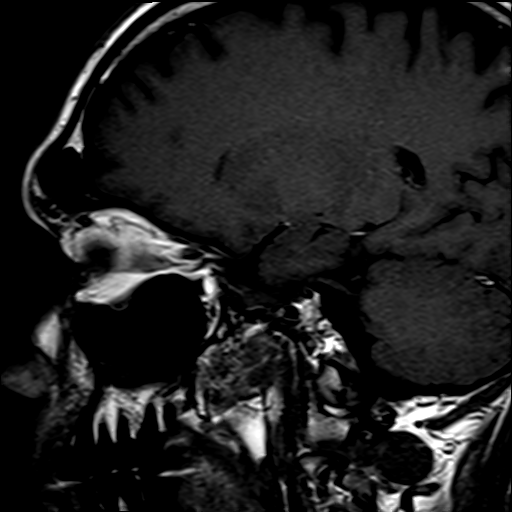

[Series 11: T1 · coronal · 3.0mm · 0.33mm/px · 1 of 13 slices shown (3 of 3)]
[im 1/13]
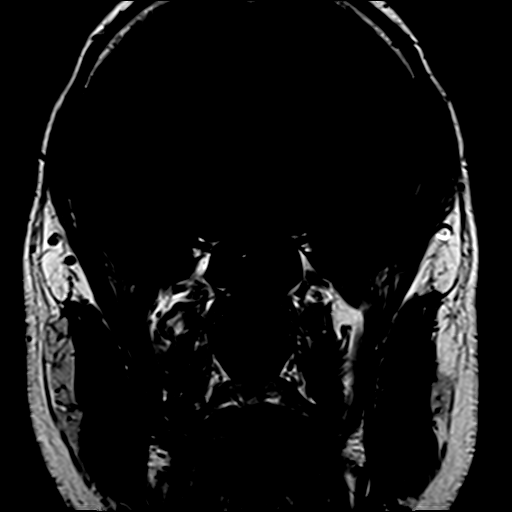

[Series 12: pre cor dynamic · coronal · non-contrast · 3.0mm · 0.35mm/px · 1 of 13 slices shown]
[im 1/13]
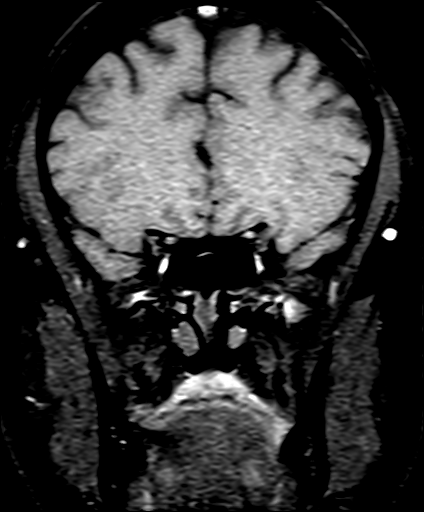

[Series 13: post fs cor · coronal · 3.0mm · 0.35mm/px · 1 of 13 slices shown]
[im 1/13]
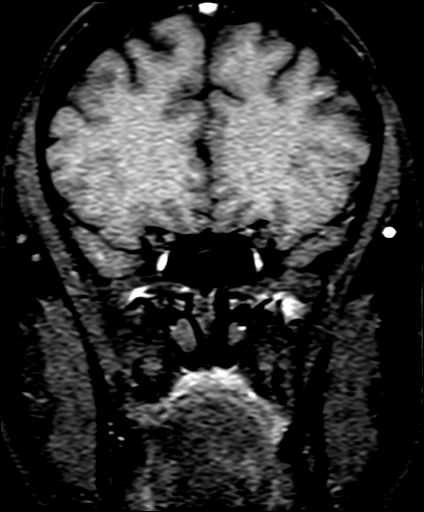

[Series 19: T1 post-contrast · sagittal · 3.0mm · 0.33mm/px · 1 of 13 slices shown (1 of 2)]
[im 1/13]
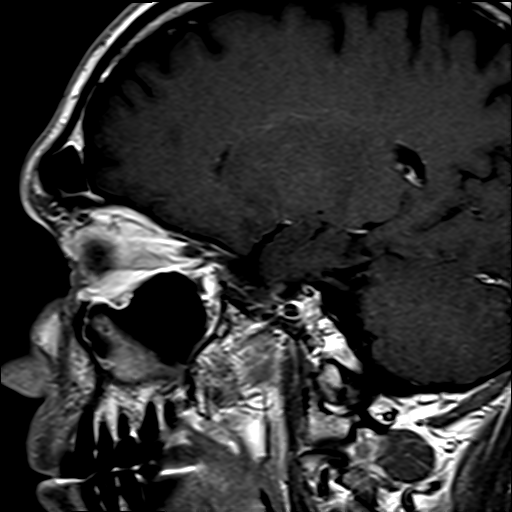

[Series 20: T1 post-contrast · coronal · 3.0mm · 0.33mm/px · 1 of 13 slices shown (2 of 2)]
[im 1/13]
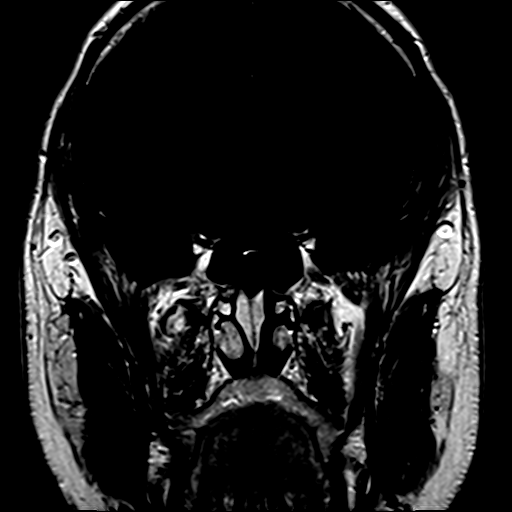

[34 of 48 positions shown; findings below may reference images not displayed]

FINDINGS: Brain: Conventional imaging the brain is unremarkable. No acute
infarct, hemorrhage, or mass lesion is present. The ventricles are
of normal size. No significant white matter lesions are present. No
significant extraaxial fluid collection is present.

The internal auditory canals are within normal limits. The brainstem
and cerebellum are within normal limits.

Postcontrast images demonstrate no pathologic enhancement.

Vascular: Flow is present in the major intracranial arteries.

Skull and upper cervical spine: The craniocervical junction is
normal. Upper cervical spine is within normal limits. Marrow signal
is unremarkable.

Sinuses/Orbits: Mild mucosal thickening is present at the inferior
maxillary sinuses bilaterally. A polyp or mucous retention cyst is
present on the right. No fluid levels are present. The globes and
orbits are within normal limits.

Other: Dedicated imaging of the sella demonstrates a normal sized
pituitary gland. The pituitary enhances homogeneously. The stalk is
midline. Optic chiasm is within normal limits. Cavernous sinus is
normal bilaterally.
IMPRESSION: 1. Normal MRI appearance of the brain and pituitary without and with
contrast.
2. Mild sinus disease.

## 2023-12-05 ENCOUNTER — Encounter: Payer: Self-pay | Admitting: Internal Medicine

## 2023-12-05 ENCOUNTER — Other Ambulatory Visit: Payer: Self-pay

## 2023-12-05 MED ORDER — CLOMIPHENE CITRATE 50 MG PO TABS: ORAL_TABLET | ORAL | 5 refills | Status: DC

## 2023-12-12 ENCOUNTER — Ambulatory Visit: Admitting: Family Medicine

## 2024-03-22 ENCOUNTER — Ambulatory Visit: Payer: Commercial Managed Care - PPO | Admitting: Family Medicine

## 2024-03-22 ENCOUNTER — Ambulatory Visit: Payer: Self-pay | Admitting: Family Medicine

## 2024-03-22 ENCOUNTER — Encounter: Payer: Self-pay | Admitting: Family Medicine

## 2024-03-22 VITALS — BP 110/70 | HR 66 | Temp 97.3°F | Ht 68.0 in | Wt 183.8 lb

## 2024-03-22 DIAGNOSIS — Z0000 Encounter for general adult medical examination without abnormal findings: Secondary | ICD-10-CM | POA: Diagnosis not present

## 2024-03-22 DIAGNOSIS — E663 Overweight: Secondary | ICD-10-CM

## 2024-03-22 DIAGNOSIS — E291 Testicular hypofunction: Secondary | ICD-10-CM | POA: Diagnosis not present

## 2024-03-22 DIAGNOSIS — I10 Essential (primary) hypertension: Secondary | ICD-10-CM

## 2024-03-22 DIAGNOSIS — E559 Vitamin D deficiency, unspecified: Secondary | ICD-10-CM

## 2024-03-22 DIAGNOSIS — E785 Hyperlipidemia, unspecified: Secondary | ICD-10-CM | POA: Diagnosis not present

## 2024-03-22 DIAGNOSIS — Z125 Encounter for screening for malignant neoplasm of prostate: Secondary | ICD-10-CM | POA: Diagnosis not present

## 2024-03-22 DIAGNOSIS — Z131 Encounter for screening for diabetes mellitus: Secondary | ICD-10-CM | POA: Diagnosis not present

## 2024-03-22 LAB — LIPID PANEL
Cholesterol: 194 mg/dL (ref 0–200)
HDL: 46.6 mg/dL (ref >39.00)
LDL Cholesterol: 112 mg/dL — ABNORMAL HIGH (ref 0–99)
NonHDL: 147.51
Total CHOL/HDL Ratio: 4
Triglycerides: 178 mg/dL — ABNORMAL HIGH (ref 0.0–149.0)
VLDL: 35.6 mg/dL (ref 0.0–40.0)

## 2024-03-22 LAB — TESTOSTERONE: Testosterone: 384.55 ng/dL (ref 300.00–890.00)

## 2024-03-22 LAB — CBC WITH DIFFERENTIAL/PLATELET
Basophils Absolute: 0 K/uL (ref 0.0–0.1)
Basophils Relative: 0.3 % (ref 0.0–3.0)
Eosinophils Absolute: 0.1 K/uL (ref 0.0–0.7)
Eosinophils Relative: 1.1 % (ref 0.0–5.0)
HCT: 46.9 % (ref 39.0–52.0)
Hemoglobin: 15.6 g/dL (ref 13.0–17.0)
Lymphocytes Relative: 33.6 % (ref 12.0–46.0)
Lymphs Abs: 2.5 K/uL (ref 0.7–4.0)
MCHC: 33.2 g/dL (ref 30.0–36.0)
MCV: 88.6 fl (ref 78.0–100.0)
Monocytes Absolute: 0.6 K/uL (ref 0.1–1.0)
Monocytes Relative: 7.9 % (ref 3.0–12.0)
Neutro Abs: 4.2 K/uL (ref 1.4–7.7)
Neutrophils Relative %: 57.1 % (ref 43.0–77.0)
Platelets: 137 K/uL — ABNORMAL LOW (ref 150.0–400.0)
RBC: 5.29 Mil/uL (ref 4.22–5.81)
RDW: 13.3 % (ref 11.5–15.5)
WBC: 7.3 K/uL (ref 4.0–10.5)

## 2024-03-22 LAB — COMPREHENSIVE METABOLIC PANEL WITH GFR
ALT: 22 U/L (ref 0–53)
AST: 19 U/L (ref 0–37)
Albumin: 4.4 g/dL (ref 3.5–5.2)
Alkaline Phosphatase: 28 U/L — ABNORMAL LOW (ref 39–117)
BUN: 16 mg/dL (ref 6–23)
CO2: 29 meq/L (ref 19–32)
Calcium: 9 mg/dL (ref 8.4–10.5)
Chloride: 103 meq/L (ref 96–112)
Creatinine, Ser: 1.01 mg/dL (ref 0.40–1.50)
GFR: 90.97 mL/min (ref >60.00)
Glucose, Bld: 92 mg/dL (ref 70–99)
Potassium: 4.3 meq/L (ref 3.5–5.1)
Sodium: 142 meq/L (ref 135–145)
Total Bilirubin: 0.7 mg/dL (ref 0.2–1.2)
Total Protein: 6.3 g/dL (ref 6.0–8.3)

## 2024-03-22 LAB — PSA: PSA: 0.87 ng/mL (ref 0.10–4.00)

## 2024-03-22 LAB — TSH: TSH: 1.15 u[IU]/mL (ref 0.35–5.50)

## 2024-03-22 LAB — HEMOGLOBIN A1C: Hgb A1c MFr Bld: 5.9 % (ref 4.6–6.5)

## 2024-03-22 LAB — VITAMIN D 25 HYDROXY (VIT D DEFICIENCY, FRACTURES): VITD: 41.14 ng/mL (ref 30.00–100.00)

## 2024-03-22 NOTE — Progress Notes (Signed)
 Phone: [phone]    Subjective:  Patient presents today for their annual physical. Chief complaint-noted.   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 Carolynn   WISDOM TOOTH EXTRACTION      Family History  Problem Relation Age of Onset   Thyroid  disease Mother    Hyperlipidemia Father    Hypertension Father    Polycystic kidney disease Father    Diabetes Father    Kidney disease Father    Diabetes Paternal Grandfather        paternal grandmother   Kidney disease Paternal Grandfather     Medications- reviewed and updated Current Outpatient Medications  Medication Sig Dispense Refill   clomiPHENE  (CLOMID ) 50 MG tablet TAKE 1 TABLET BY MOUTH 4 TIMES WEEKLY AS DIRECTED 16 tablet 5   fluticasone  (FLONASE ) 50 MCG/ACT nasal spray Place 1 spray into both nostrils daily as needed for allergies or rhinitis.     No current facility-administered medications for this visit.    Allergies-reviewed and updated Allergies  Allergen Reactions   Amoxicillin     REACTION: as child unspecified    Social History   Social History Narrative   Married (wife patient with Dr. Katrinka), Daughter charlotte born 2019, son born November  2022. 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       Hobbies: golfing, boating, travel      Objective:  BP 110/70   Pulse 66   Temp (!) 97.3 F (36.3 C)   Ht 5' 8 (1.727 m)   Wt 183 lb 12.8 oz (83.4 kg)   SpO2 98%   BMI 27.95 kg/m  Gen: NAD, resting comfortably HEENT: Mucous membranes are moist. Oropharynx normal Neck: no thyromegaly CV: RRR no murmurs rubs or gallops Lungs: CTAB no crackles, wheeze, rhonchi Abdomen: soft/nontender/nondistended/normal bowel sounds. No rebound or guarding.  Ext: no edema Skin: warm, dry Neuro: grossly normal, moves all extremities, PERRLA Declines genitourinary or rectal exam    Assessment and Plan:  43 y.o. male presenting for annual physical.  Health Maintenance counseling: 1. Anticipatory guidance: Patient counseled regarding regular dental exams -q6 months, eye exams -yearly with contacts or glasses- bifocals this time,  avoiding smoking and second hand smoke , limiting alcohol to 2 beverages per day- 2 a week or less, no illicit drugs.   2. Risk factor reduction:  Advised patient of need for regular exercise  and diet rich and fruits and vegetables to reduce risk of heart attack and stroke.  Exercise- crossfit 2 days a week- challenging with his schedule to get back to the 3-4 but would like to.  Diet/weight management-12 lbs down- intermittent fasting restart 5 months ago and got as low as 180 on home scales- even with travel to [place] only slightly up.  Wt Readings from Last 3 Encounters:  03/22/24 183 lb 12.8 oz (83.4 kg)  05/04/23 201 lb 12.8 oz (91.5 kg)  03/21/23 195 lb (88.5 kg)  3. Immunizations/screenings/ancillary studies- opts out flu for now  (may get later in year) and COVID shot. HBV had in childhood. Also had hepatitis A Immunization History  Administered Date(s) Administered   Influenza Split 05/15/2012   Influenza, Seasonal, Injecte, Preservative Fre 06/03/2014   Influenza,inj,Quad PF,6+ Mos 05/13/2018, 05/26/2019   Influenza-Unspecified 06/26/2017   Moderna Sars-Covid-2 Vaccination 10/18/2019, 11/08/2019, 06/05/2020   Tdap 10/11/2010, 03/12/2021  4. Prostate cancer screening- check PSA with ongoing clomid   Lab Results  Component Value Date   PSA 0.76 03/29/2023   PSA 0.77 06/25/2021   5. Colon cancer screening - no family history, start at age 70  6. Skin cancer screening/prevention- 2 year cycle with dermatology with good reports. advised regular sunscreen use. Denies worrisome, changing, or new skin lesions.  7. Testicular cancer screening- advised monthly self exams  8. STD screening- patient opts out- only active with wife 9. Smoking associated screening- never smoker  Status of chronic or acute concerns   #hypogonadism- follow up with DrRONITA Butts- we will check CBC, testosterone  and PSA given ongoing clomid  4x a week .   #HTN- at goal with weight loss- from peak weight of 220!   #hyperlipidemia S: Medication:none  Lab Results  Component Value Date   CHOL 186 03/29/2023   HDL 43.80 03/29/2023   LDLCALC 106 (H) 10/31/2018   LDLDIRECT 135.0 03/29/2023   TRIG 203.0 (H) 03/29/2023   CHOLHDL 4 03/29/2023   A/P: The 10-year ASCVD risk score (Arnett DK, et al., 2019) is: 1.3%. Only mild elevations- not in range for treatment- keep working on healthy eating and regular exercise - would prefer triglyceride(s) slightly lowre  #Vitamin D  deficiency S: Medication: 2200 units plus multivitamin  Last vitamin D  Lab Results  Component Value Date   VD25OH 35.00 03/29/2023  A/P: hopefully stable- update vitamin D  today. Continue current meds for now    Recommended follow up: Return in about 1 year (around  03/22/2025) for physical or sooner if needed.Schedule b4 you leave. Future Appointments  Date Time Provider Department Center  05/03/2024  7:30 AM Shamleffer, Donell Cardinal, MD [facility] None   Lab/Order associations: fasting   ICD-10-CM   1. Preventative health care  Z00.00     2. Primary hypertension  I10     3. Vitamin D  deficiency  E55.9     4. Screening for prostate cancer  Z12.5     5. Mild hyperlipidemia  E78.5     6. Secondary male hypogonadism  E29.1     7. Screening for diabetes mellitus  Z13.1     8. Overweight  E66.3       No orders of the defined types were placed in this encounter.   Return precautions advised.   Garnette Lukes, MD

## 2024-03-22 NOTE — Patient Instructions (Addendum)
 Please stop by lab before you go If you have mychart- we will send your results within 3 business days of us  receiving them.  If you do not have mychart- we will call you about results within 5 business days of us  receiving them.  *please also note that you will see labs on mychart as soon as they post. I will later go in and write notes on them- will say notes from Dr. Katrinka Cheng job getting back on intermittent fasting- if you have any extra space made available- would like the exercise days to pick back up  Recommended follow up: Return in about 1 year (around 03/22/2025) for physical or sooner if needed.Schedule b4 you leave.

## 2024-04-23 ENCOUNTER — Ambulatory Visit: Admitting: Internal Medicine

## 2024-04-23 ENCOUNTER — Encounter: Payer: Self-pay | Admitting: Internal Medicine

## 2024-04-23 VITALS — BP 124/76 | HR 57 | Ht 68.0 in | Wt 194.6 lb

## 2024-04-23 DIAGNOSIS — E291 Testicular hypofunction: Secondary | ICD-10-CM | POA: Diagnosis not present

## 2024-04-23 MED ORDER — CLOMIPHENE CITRATE 50 MG PO TABS: ORAL_TABLET | ORAL | 11 refills | Status: AC

## 2024-04-23 NOTE — Progress Notes (Signed)
 Name: Dan [name]  MRN/ DOB: [id number], 1980-12-18    Age/ Sex: 43 y.o., male    PCP: Katrinka Garnette KIDD, MD   Reason for Endocrinology Evaluation: Hypogonadism     Date of Initial Endocrinology Evaluation: 06/18/2021    HPI: Mr. Dan [name] is a 44 y.o. male with a past medical history of HTN and dyslipidemia. The patient presented for initial endocrinology clinic visit on 06/18/2021 for consultative assistance with his hypogonadism.   Patient has been diagnosed with hypogonadism based on 2 low testosterone  values of 204.37 NG/DL and 836 NG/dL and 2/79/7977 and 1/79/7977, this has been ordered based on patient request due to complaints of fatigue.   On his initial visit to our clinic he was still c/o  fatigue and difficulty lifting weights  Denied erectile dysfunction or  decrease libido    Has 2 biological children , has history of low sperm mobility   He has never had testosterone  replacement  NO OTC supplements  No Marijuana use, uses a CBD product for insomnia   No narcotic  meds    No fh of prostate cancer or CAD    On  his initial visit to our clinic he had a mildly low testosterone  at 218 ng/dL with normal LH 8.43 mIU/mL. Normal TFT;s and Prolactin as well as undetectable HCG    Due to a diagnosis of secondary hypogonadism, we proceeded with pituitary images which were normal.    S/p vasectomy in 2024   SUBJECTIVE:   Today (04/23/24):  Mr. Dan [name] is here for follow-up on hypogonadism.   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 after tonsilectomy    Hypertension    Past Surgical History:  Past Surgical History:  Procedure Laterality Date   TONSILLECTOMY     post op bleeding (pt report)   VASECTOMY     january 2024- [facility] Dr. Carolynn   WISDOM TOOTH EXTRACTION      Social History:  reports that he has never smoked. He has never used smokeless tobacco. He reports current alcohol use. He reports that he does not use drugs. Family History: family history includes Diabetes in his father and paternal grandfather; Hyperlipidemia in his father; Hypertension in his father; Kidney disease in his father and paternal grandfather; Polycystic kidney disease in his father; Thyroid  disease in his mother.   HOME MEDICATIONS: Allergies as of 04/23/2024       Reactions   Amoxicillin    REACTION: as child unspecified        Medication List        Accurate as of April 23, 2024  7:22 AM. If you have any questions, ask your nurse or doctor.          clomiPHENE  50 MG tablet Commonly known as: Clomid  TAKE 1 TABLET BY MOUTH 4 TIMES WEEKLY AS DIRECTED   fluticasone  50 MCG/ACT nasal spray Commonly known as: FLONASE  Place 1 spray into both nostrils daily as needed for allergies or rhinitis.          REVIEW OF SYSTEMS:  A comprehensive ROS was conducted with the patient and is negative except as per HPI     OBJECTIVE:  VS: There were no vitals taken for this visit.   Wt Readings from Last 3 Encounters:  03/22/24 183 lb 12.8 oz (83.4 kg)  05/04/23 201 lb 12.8 oz (91.5 kg)  03/21/23 195 lb (88.5 kg)     EXAM: General: Pt appears well and is in NAD  Chest:  No gynecomastia  Lungs: Clear with good BS bilat   Heart: Auscultation: RRR.  Abdomen: soft, nontender  Extremities: No LE edema   Mental Status: Judgment, insight: Intact Orientation: Oriented to time, place, and person Mood and affect: No depression, anxiety, or agitation     DATA REVIEWED:    Latest Reference Range & Units 03/22/24 08:26   Comprehensive metabolic panel with GFR  Rpt !  Sodium 135 - 145 mEq/L 142  Potassium 3.5 - 5.1 mEq/L 4.3  Chloride 96 - 112 mEq/L 103  CO2 19 - 32 mEq/L 29  Glucose 70 - 99 mg/dL 92  BUN 6 - 23 mg/dL 16  Creatinine 9.59 - 8.49 mg/dL 8.98  Calcium 8.4 - 89.4 mg/dL 9.0  Alkaline Phosphatase 39 - 117 U/L 28 (L)  Albumin 3.5 - 5.2 g/dL 4.4  AST 0 - 37 U/L 19  ALT 0 - 53 U/L 22  Total Protein 6.0 - 8.3 g/dL 6.3  Total Bilirubin 0.2 - 1.2 mg/dL 0.7  GFR >39.99 mL/min 90.97    Latest Reference Range & Units 03/22/24 08:26  Hemoglobin A1C 4.6 - 6.5 % 5.9    Latest Reference Range & Units 03/22/24 08:26  Testosterone  300.00 - 890.00 ng/dL 615.44   Results for DERRIN, CURREY (MRN [id number]) as of 06/18/2021 14:03  Ref. Range 03/05/2021 08:40 03/26/2021 08:29  Testosterone  Latest Ref Range: 250 - 827 ng/dL 795.62 (L) 836 (L)     MRI brain 07/24/2021 IMPRESSION: 1. Normal MRI appearance of the brain and pituitary without and with contrast. 2. Mild sinus disease   ASSESSMENT/PLAN/RECOMMENDATIONS:   Secondary Hypogonadism:   -Patient with appropriate clinical outcome with clomiphene  use -Labs through PCPs office were reviewed -No changes   Medication Continue clomiphene  50 mg , 4 times weekly    Follow-up in 1 year   Signed electronically by: Stefano Redgie Butts, MD  [facility]  [facility] [address] [place], [place] [address] Phone: [phone] FAX: [phone]   CC: KATRINKA Garnette KIDD, MD [address] [place] [address] Phone: [phone] Fax: [phone]   Return to Endocrinology clinic as below: Future Appointments  Date Time Provider Department Center  04/23/2024  7:50 AM Ellin Fitzgibbons, Donell Redgie, MD [facility] None  03/25/2025  9:00 AM KATRINKA Garnette KIDD, MD [facility]

## 2024-05-03 ENCOUNTER — Ambulatory Visit: Payer: Commercial Managed Care - PPO | Admitting: Internal Medicine

## 2024-08-28 ENCOUNTER — Ambulatory Visit: Admitting: Family

## 2024-08-28 ENCOUNTER — Encounter: Payer: Self-pay | Admitting: Family

## 2024-08-28 VITALS — BP 134/84 | HR 78 | Temp 97.9°F | Ht 68.0 in | Wt 205.2 lb

## 2024-08-28 DIAGNOSIS — I10 Essential (primary) hypertension: Secondary | ICD-10-CM | POA: Diagnosis not present

## 2024-08-28 MED ORDER — LOSARTAN POTASSIUM 25 MG PO TABS: 25.0000 mg | ORAL_TABLET | Freq: Every day | ORAL | 1 refills | Status: DC

## 2024-08-28 NOTE — Progress Notes (Signed)
 "  Patient ID: Dan [name], male    DOB: 12-31-1980, 44 y.o.   MRN: [id number]  Chief Complaint  Patient presents with   Hypertension    Pt states his BP has been elevated 160/100 on Monday night and 142/96 this morning. Patient has a hx of hypertension in the past.   Discussed the use of AI scribe software for clinical note transcription with the patient, who gave verbal consent to proceed.  History of Present Illness Dan [name] is a 44 year old male with hypertension who presents with concerns about elevated blood pressure.  He was previously treated with losartan  but stopped it many years ago and has relied on diet and exercise for blood pressure control. Recently he has felt more anxious which he thinks is contributing to elevated blood pressure, especially as he prepares to leave the country tomorrow. He denies regular headaches, dizziness, or other typical hypertension symptoms but has a mild headache today, which is unusual for him. He monitors his blood pressure at home with an automatic cuff. Readings were elevated this week, including 160/100 on Monday night, 142/96 on Tuesday morning, 132/92 on Tuesday night, and 139/98 this morning. He recalls that while on losartan  his blood pressure sometimes dropped to about 108/72, which he tolerated. He has gained weight recently and plans to restart an intermittent fasting diet after his trip, which previously led to 20-30 pounds of weight loss and improved control.  Assessment & Plan Essential hypertension Recent elevated readings at home, normal in office today. Discussed stroke risk and microvascular damage to all organs. Losartan  previously effective and well-tolerated. Explained potential lightheadedness with medication after achieving weight loss and BP reduction. - Prescribed losartan  25 mg once daily, take in morning or lunch time. - Advised monitoring BP 1-2 times daily, ok to wait until after vacation. - Report  elevated readings >135./90, either number. - Encouraged low sodium diet and increased water intake during vacation. - Encouraged increased cardio exercise as many days as able and get back on weight loss plan. - Recommended follow-up with PCP in 1-2 months for BP assessment and medication adjustment.  Subjective:    Outpatient Medications Prior to Visit  Medication Sig Dispense Refill   clomiPHENE  (CLOMID ) 50 MG tablet TAKE 1 TABLET BY MOUTH 4 TIMES WEEKLY AS DIRECTED 16 tablet 11   fluticasone  (FLONASE ) 50 MCG/ACT nasal spray Place 1 spray into both nostrils daily as needed for allergies or rhinitis.     No facility-administered medications prior to visit.   Past Medical History:  Diagnosis Date   Blood transfusion without reported diagnosis    HIV and Hep C negative-early 90s, late 17s, pt report. after tonsilectomy   Hypertension    Past Surgical History:  Procedure Laterality Date   TONSILLECTOMY     post op bleeding (pt report)   VASECTOMY     january 2024- [facility] Dr. Carolynn   WISDOM TOOTH EXTRACTION     Allergies[1]    Objective:    Physical Exam Vitals and nursing note reviewed.  Constitutional:      General: He is not in acute distress.    Appearance: Normal appearance. He is obese.  HENT:     Head: Normocephalic.  Cardiovascular:     Rate and Rhythm: Normal rate and regular rhythm.  Pulmonary:     Effort: Pulmonary effort is normal.     Breath sounds: Normal breath sounds.  Musculoskeletal:        General:  Normal range of motion.     Cervical back: Normal range of motion.  Skin:    General: Skin is warm and dry.  Neurological:     Mental Status: He is alert and oriented to person, place, and time.  Psychiatric:        Mood and Affect: Mood normal.    BP 134/84 (BP Location: Left Arm, Patient Position: Sitting, Cuff Size: Normal)   Pulse 78   Temp 97.9 F (36.6 C) (Temporal)   Ht 5' 8 (1.727 m)   Wt 205 lb 3.2 oz (93.1 kg)   SpO2 97%    BMI 31.20 kg/m  Wt Readings from Last 3 Encounters:  08/28/24 205 lb 3.2 oz (93.1 kg)  04/23/24 194 lb 9.6 oz (88.3 kg)  03/22/24 183 lb 12.8 oz (83.4 kg)      Shantee Hayne, NP     [1]  Allergies Allergen Reactions   Amoxicillin     REACTION: as child unspecified   "

## 2024-08-30 ENCOUNTER — Telehealth: Admitting: Physician Assistant

## 2024-08-30 ENCOUNTER — Encounter: Payer: Self-pay | Admitting: Family

## 2024-08-30 ENCOUNTER — Encounter: Payer: Self-pay | Admitting: Family Medicine

## 2024-08-30 DIAGNOSIS — I10 Essential (primary) hypertension: Secondary | ICD-10-CM

## 2024-08-30 NOTE — Progress Notes (Signed)
 " Virtual Visit Consent   [name], you are scheduled for a virtual visit with a [facility] provider today. Just as with appointments in the office, your consent must be obtained to participate. Your consent will be active for this visit and any virtual visit you may have with one of our providers in the next 365 days. If you have a MyChart account, a copy of this consent can be sent to you electronically.  As this is a virtual visit, video technology does not allow for your provider to perform a traditional examination. This may limit your provider's ability to fully assess your condition. If your provider identifies any concerns that need to be evaluated in person or the need to arrange testing (such as labs, EKG, etc.), we will make arrangements to do so. Although advances in technology are sophisticated, we cannot ensure that it will always work on either your end or our end. If the connection with a video visit is poor, the visit may have to be switched to a telephone visit. With either a video or telephone visit, we are not always able to ensure that we have a secure connection.  By engaging in this virtual visit, you consent to the provision of healthcare and authorize for your insurance to be billed (if applicable) for the services provided during this visit. Depending on your insurance coverage, you may receive a charge related to this service.  I need to obtain your verbal consent now. Are you willing to proceed with your visit today? Dan [name] has provided verbal consent on 08/30/2024 for a virtual visit (video or telephone). [name], [place]  Date: 08/30/2024 6:11 PM   Virtual Visit via Video Note   I, [name], connected with  Dan [name]  ([id number], [birth year]) on 08/30/24 at  6:15 PM EST by a video-enabled telemedicine application and verified that I am speaking with the correct person using two identifiers.  Location: Patient: Virtual Visit Location  Patient: Home Provider: Virtual Visit Location Provider: Home Office   I discussed the limitations of evaluation and management by telemedicine and the availability of in person appointments. The patient expressed understanding and agreed to proceed.    History of Present Illness: Dan [name] is a 44 y.o. who identifies as a male who was assigned male at birth, and is being seen today for HTN.  HPI: Hypertension This is a recurrent problem. The current episode started in the past 7 days. The problem is unchanged. The problem is uncontrolled. Associated symptoms include anxiety. Pertinent negatives include no blurred vision, chest pain, headaches, malaise/fatigue, palpitations, peripheral edema, PND, shortness of breath or sweats. Risk factors for coronary artery disease include male gender. There are no compliance problems.     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  Resting comfortably in car.  Head is normocephalic,atraumatic.  No labored breathing.  Speech is clear and coherent with logical content.  Patient is alert and oriented at baseline.    Assessment and Plan: 1. Primary hypertension (Primary)  [place] Has seen his provider and started on losartan  this week.  BP is 150/100. Advised to report to nearest ER. Patient agreed with this plan.  Follow Up  Instructions: I discussed the assessment and treatment plan with the patient. The patient was provided an opportunity to ask questions and all were answered. The patient agreed with the plan and demonstrated an understanding of the instructions.  A copy of instructions were sent to the patient via  MyChart unless otherwise noted below.    The patient was advised to call back or seek an in-person evaluation if the symptoms worsen or if the condition fails to improve as anticipated.    [name], PA-C     [1]  Allergies Allergen Reactions   Amoxicillin     REACTION: as child unspecified  [2]  Current Outpatient Medications:    clomiPHENE  (CLOMID ) 50 MG tablet, TAKE 1 TABLET BY MOUTH 4 TIMES WEEKLY AS DIRECTED, Disp: 16 tablet, Rfl: 11   fluticasone  (FLONASE ) 50 MCG/ACT nasal spray, Place 1 spray into both nostrils daily as needed for allergies or rhinitis., Disp: , Rfl:    losartan  (COZAAR ) 25 MG tablet, Take 1 tablet (25 mg total) by mouth daily., Disp: 90 tablet, Rfl: 1  "

## 2024-08-30 NOTE — Patient Instructions (Signed)
" °  Dan [name], thank you for joining [name], PA-C for today's virtual visit.  While this provider is not your primary care provider (PCP), if your PCP is located in our provider database this encounter information will be shared with them immediately following your visit.   A [facility] MyChart account gives you access to today's visit and all your visits, tests, and labs performed at [facility]  click here if you don't have a [facility] MyChart account or go to [email or web address]  Consent: (Patient) Dan [name] provided verbal consent for this virtual visit at the beginning of the encounter.  Current Medications:  Current Outpatient Medications:    clomiPHENE  (CLOMID ) 50 MG tablet, TAKE 1 TABLET BY MOUTH 4 TIMES WEEKLY AS DIRECTED, Disp: 16 tablet, Rfl: 11   fluticasone  (FLONASE ) 50 MCG/ACT nasal spray, Place 1 spray into both nostrils daily as needed for allergies or rhinitis., Disp: , Rfl:    losartan  (COZAAR ) 25 MG tablet, Take 1 tablet (25 mg total) by mouth daily., Disp: 90 tablet, Rfl: 1   Medications ordered in this encounter:  No orders of the defined types were placed in this encounter.    *If you need refills on other medications prior to your next appointment, please contact your pharmacy*  Follow-Up: Call back or seek an in-person evaluation if the symptoms worsen or if the condition fails to improve as anticipated.  [facility] [phone]  Other Instructions Report to ER.   If you have been instructed to have an in-person evaluation today at a local Urgent Care facility, please use the link below. It will take you to a list of all of our available [facility], including address, phone number and hours of operation. Please do not delay care.  [facility]  If you or a family member do not have a primary care provider, use the link below to schedule a visit and establish care. When you  choose a [facility] primary care physician or advanced practice provider, you gain a long-term partner in health. Find a Primary Care Provider  Learn more about [facility]'s in-office and virtual care options: [facility] - Get Care Now  "

## 2024-08-30 NOTE — Telephone Encounter (Signed)
 Hi Haskell,  You can go ahead and increase the dose to 2 pills, 50mg . If working, we can get send the higher dose when you are back in town.

## 2024-09-11 ENCOUNTER — Other Ambulatory Visit: Payer: Self-pay | Admitting: Family Medicine

## 2024-09-11 MED ORDER — LOSARTAN POTASSIUM 50 MG PO TABS: 50.0000 mg | ORAL_TABLET | Freq: Every day | ORAL | 3 refills | Status: AC

## 2024-09-27 ENCOUNTER — Ambulatory Visit: Admitting: Family Medicine

## 2025-03-25 ENCOUNTER — Encounter: Admitting: Family Medicine

## 2025-04-23 ENCOUNTER — Ambulatory Visit: Admitting: Internal Medicine
